# Patient Record
Sex: Female | Born: 1950 | Race: Black or African American | Hispanic: No | Smoking: Never smoker
Health system: Southern US, Community
[De-identification: ages and names within clinical notes are randomized; demographics above are authoritative.]

## PROBLEM LIST (undated history)

## (undated) DIAGNOSIS — I10 Essential (primary) hypertension: Secondary | ICD-10-CM

## (undated) HISTORY — PX: DILATION AND CURETTAGE OF UTERUS: SHX78

---

## 1999-07-08 ENCOUNTER — Other Ambulatory Visit: Admission: RE | Admit: 1999-07-08 | Discharge: 1999-07-08 | Payer: Self-pay | Admitting: Obstetrics and Gynecology

## 2001-06-14 ENCOUNTER — Emergency Department (HOSPITAL_COMMUNITY): Admission: EM | Admit: 2001-06-14 | Discharge: 2001-06-14 | Payer: Self-pay | Admitting: *Deleted

## 2002-07-17 ENCOUNTER — Emergency Department (HOSPITAL_COMMUNITY): Admission: EM | Admit: 2002-07-17 | Discharge: 2002-07-17 | Payer: Self-pay | Admitting: Emergency Medicine

## 2002-11-11 ENCOUNTER — Emergency Department (HOSPITAL_COMMUNITY): Admission: EM | Admit: 2002-11-11 | Discharge: 2002-11-11 | Payer: Self-pay

## 2003-01-25 ENCOUNTER — Emergency Department (HOSPITAL_COMMUNITY): Admission: EM | Admit: 2003-01-25 | Discharge: 2003-01-25 | Payer: Self-pay | Admitting: Emergency Medicine

## 2004-01-31 ENCOUNTER — Ambulatory Visit (HOSPITAL_COMMUNITY): Admission: RE | Admit: 2004-01-31 | Discharge: 2004-01-31 | Payer: Self-pay | Admitting: Internal Medicine

## 2005-01-27 ENCOUNTER — Ambulatory Visit (HOSPITAL_COMMUNITY): Admission: RE | Admit: 2005-01-27 | Discharge: 2005-01-27 | Payer: Self-pay | Admitting: Internal Medicine

## 2005-02-09 ENCOUNTER — Ambulatory Visit (HOSPITAL_COMMUNITY): Admission: RE | Admit: 2005-02-09 | Discharge: 2005-02-09 | Payer: Self-pay | Admitting: Internal Medicine

## 2005-05-04 ENCOUNTER — Ambulatory Visit: Payer: Self-pay | Admitting: Orthopedic Surgery

## 2005-06-04 ENCOUNTER — Ambulatory Visit: Payer: Self-pay | Admitting: Orthopedic Surgery

## 2007-10-19 ENCOUNTER — Ambulatory Visit: Payer: Self-pay | Admitting: Orthopedic Surgery

## 2007-10-19 DIAGNOSIS — M19049 Primary osteoarthritis, unspecified hand: Secondary | ICD-10-CM | POA: Insufficient documentation

## 2007-10-19 DIAGNOSIS — M654 Radial styloid tenosynovitis [de Quervain]: Secondary | ICD-10-CM

## 2009-03-25 ENCOUNTER — Ambulatory Visit (HOSPITAL_COMMUNITY): Admission: RE | Admit: 2009-03-25 | Discharge: 2009-03-25 | Payer: Self-pay | Admitting: Internal Medicine

## 2010-07-28 ENCOUNTER — Encounter: Payer: Self-pay | Admitting: Orthopedic Surgery

## 2010-08-11 ENCOUNTER — Emergency Department (HOSPITAL_COMMUNITY): Admission: EM | Admit: 2010-08-11 | Discharge: 2010-08-11 | Payer: Self-pay | Admitting: Emergency Medicine

## 2010-11-11 NOTE — Letter (Signed)
Summary: missed appointment/no show  missed appointment/no show   Imported By: Cammie Sickle 08/02/2010 19:11:53  _____________________________________________________________________  External Attachment:    Type:   Image     Comment:   External Document

## 2010-11-13 ENCOUNTER — Other Ambulatory Visit (HOSPITAL_COMMUNITY): Payer: Self-pay | Admitting: Obstetrics

## 2010-11-13 DIAGNOSIS — Z1231 Encounter for screening mammogram for malignant neoplasm of breast: Secondary | ICD-10-CM

## 2010-11-13 DIAGNOSIS — Z139 Encounter for screening, unspecified: Secondary | ICD-10-CM

## 2010-11-14 NOTE — Progress Notes (Signed)
Summary: Office Visit  Office Visit   Imported By: Elvera Maria 10/12/2007 09:02:27  _____________________________________________________________________  External Attachment:    Type:   Image     Comment:   progress

## 2010-11-21 ENCOUNTER — Other Ambulatory Visit (HOSPITAL_COMMUNITY): Payer: Self-pay | Admitting: Obstetrics

## 2010-11-21 ENCOUNTER — Ambulatory Visit (HOSPITAL_COMMUNITY)
Admission: RE | Admit: 2010-11-21 | Discharge: 2010-11-21 | Disposition: A | Discharge status: Home / Self Care | Payer: BC Managed Care – PPO | Source: Ambulatory Visit | Attending: Obstetrics | Admitting: Obstetrics

## 2010-11-21 DIAGNOSIS — Z1231 Encounter for screening mammogram for malignant neoplasm of breast: Secondary | ICD-10-CM

## 2010-11-26 ENCOUNTER — Ambulatory Visit (HOSPITAL_COMMUNITY): Payer: Self-pay

## 2011-02-11 ENCOUNTER — Encounter (HOSPITAL_COMMUNITY): Payer: BC Managed Care – PPO

## 2011-02-11 ENCOUNTER — Other Ambulatory Visit: Payer: Self-pay | Admitting: Obstetrics

## 2011-02-11 LAB — CBC
HCT: 41.2 % (ref 36.0–46.0)
Hemoglobin: 13.5 g/dL (ref 12.0–15.0)
MCV: 90.4 fL (ref 78.0–100.0)

## 2011-02-19 ENCOUNTER — Ambulatory Visit (HOSPITAL_COMMUNITY)
Admission: RE | Admit: 2011-02-19 | Discharge: 2011-02-19 | Disposition: A | Discharge status: Home / Self Care | Payer: BC Managed Care – PPO | Source: Ambulatory Visit | Attending: Obstetrics | Admitting: Obstetrics

## 2011-02-19 ENCOUNTER — Other Ambulatory Visit: Payer: Self-pay | Admitting: Obstetrics

## 2011-02-19 DIAGNOSIS — Z01818 Encounter for other preprocedural examination: Secondary | ICD-10-CM | POA: Insufficient documentation

## 2011-02-19 DIAGNOSIS — N950 Postmenopausal bleeding: Secondary | ICD-10-CM | POA: Insufficient documentation

## 2011-02-19 DIAGNOSIS — N882 Stricture and stenosis of cervix uteri: Secondary | ICD-10-CM | POA: Insufficient documentation

## 2011-02-19 DIAGNOSIS — Z01812 Encounter for preprocedural laboratory examination: Secondary | ICD-10-CM | POA: Insufficient documentation

## 2011-02-25 NOTE — Op Note (Signed)
  NAME:  Destiny Wheeler, Destiny Wheeler NO.:  [id number]  MEDICAL RECORD NO.:  [id number]           PATIENT TYPE:  O  LOCATION:  [facility]                          FACILITY[facility]  [facility]  PHYSICIAN:  Kathreen Cosier, M.D.DATE OF BIRTH:  04-15-1951  DATE OF PROCEDURE:  02/19/2011 DATE OF DISCHARGE:                              OPERATIVE REPORT   PREOPERATIVE DIAGNOSES:  Postmenopausal bleeding with cervical stenosis.  POSTOPERATIVE DIAGNOSES:  Postmenopausal bleeding with cervical stenosis.  Under general anesthesia, the patient lithotomy position, perineum and vagina prepped and draped.  Bladder emptied with a straight catheter. Bimanual exam revealed uterus to be small, negative adnexa.  Speculum placed in the vagina.  Cervix injected with 10 mL of 1% Xylocaine. Anterior lip of the cervix grasped with tenaculum.  The cervix was noted to be stenotic.  The cavity sounded 8 cm.  Cervix dilated to 21 Pratt and we could go no further, so the procedure was limited through a sharp curettage, small amount of tissue obtained.  The hysteroscopy not done. The patient taken to the recovery room in good condition.          ______________________________ Kathreen Cosier, M.D.     BAM/MEDQ  D:  02/19/2011  T:  02/19/2011  Job:  [id number]  Electronically Signed by Francoise Ceo M.D. on 02/25/2011 09:44:47 AM

## 2016-08-12 ENCOUNTER — Encounter (HOSPITAL_COMMUNITY): Payer: Self-pay | Admitting: Emergency Medicine

## 2016-08-12 ENCOUNTER — Emergency Department (HOSPITAL_COMMUNITY)
Admission: EM | Admit: 2016-08-12 | Discharge: 2016-08-12 | Disposition: A | Discharge status: Home / Self Care | Payer: Medicare (Managed Care) | Attending: Emergency Medicine | Admitting: Emergency Medicine

## 2016-08-12 DIAGNOSIS — M62838 Other muscle spasm: Secondary | ICD-10-CM

## 2016-08-12 DIAGNOSIS — E860 Dehydration: Secondary | ICD-10-CM | POA: Insufficient documentation

## 2016-08-12 DIAGNOSIS — R252 Cramp and spasm: Secondary | ICD-10-CM | POA: Insufficient documentation

## 2016-08-12 DIAGNOSIS — M79661 Pain in right lower leg: Secondary | ICD-10-CM | POA: Diagnosis present

## 2016-08-12 LAB — CBC WITH DIFFERENTIAL/PLATELET
BASOS ABS: 0 10*3/uL (ref 0.0–0.1)
BASOS PCT: 0 %
Eosinophils Absolute: 0.2 10*3/uL (ref 0.0–0.7)
Eosinophils Relative: 1 %
HEMATOCRIT: 38.3 % (ref 36.0–46.0)
HEMOGLOBIN: 12.4 g/dL (ref 12.0–15.0)
LYMPHS PCT: 25 %
Lymphs Abs: 3.1 10*3/uL (ref 0.7–4.0)
MCH: 28.8 pg (ref 26.0–34.0)
MCHC: 32.4 g/dL (ref 30.0–36.0)
MCV: 88.9 fL (ref 78.0–100.0)
MONO ABS: 0.8 10*3/uL (ref 0.1–1.0)
Monocytes Relative: 6 %
NEUTROS ABS: 8.6 10*3/uL — AB (ref 1.7–7.7)
NEUTROS PCT: 68 %
Platelets: 290 10*3/uL (ref 150–400)
RBC: 4.31 MIL/uL (ref 3.87–5.11)
RDW: 13.6 % (ref 11.5–15.5)
WBC: 12.7 10*3/uL — ABNORMAL HIGH (ref 4.0–10.5)

## 2016-08-12 LAB — BASIC METABOLIC PANEL
ANION GAP: 7 (ref 5–15)
BUN: 25 mg/dL — ABNORMAL HIGH (ref 6–20)
CALCIUM: 9.2 mg/dL (ref 8.9–10.3)
CHLORIDE: 108 mmol/L (ref 101–111)
CO2: 26 mmol/L (ref 22–32)
Creatinine, Ser: 0.81 mg/dL (ref 0.44–1.00)
GFR calc non Af Amer: >60 mL/min (ref >60)
GLUCOSE: 107 mg/dL — AB (ref 65–99)
POTASSIUM: 3.9 mmol/L (ref 3.5–5.1)
Sodium: 141 mmol/L (ref 135–145)

## 2016-08-12 LAB — MAGNESIUM: Magnesium: 2.1 mg/dL (ref 1.7–2.4)

## 2016-08-12 LAB — CK: CK TOTAL: 276 U/L — AB (ref 38–234)

## 2016-08-12 MED ORDER — NAPROXEN 375 MG PO TABS: 375.0000 mg | ORAL_TABLET | Freq: Two times a day (BID) | ORAL | 0 refills | Status: AC | PRN

## 2016-08-12 MED ORDER — SODIUM CHLORIDE 0.9 % IV BOLUS (SEPSIS)
1000.0000 mL | Freq: Once | INTRAVENOUS | Status: AC
  Administered 2016-08-12: 1000 mL via INTRAVENOUS

## 2016-08-12 MED ORDER — KETOROLAC TROMETHAMINE 15 MG/ML IJ SOLN
15.0000 mg | Freq: Once | INTRAMUSCULAR | Status: AC
  Administered 2016-08-12: 15 mg via INTRAVENOUS
  Filled 2016-08-12: qty 1

## 2016-08-12 MED ORDER — CYCLOBENZAPRINE HCL 10 MG PO TABS: 10.0000 mg | ORAL_TABLET | Freq: Three times a day (TID) | ORAL | 0 refills | Status: DC | PRN

## 2016-08-12 MED ORDER — OMEPRAZOLE 20 MG PO CPDR: 20.0000 mg | DELAYED_RELEASE_CAPSULE | Freq: Every day | ORAL | 0 refills | Status: DC

## 2016-08-12 MED ORDER — DIAZEPAM 5 MG/ML IJ SOLN
5.0000 mg | Freq: Once | INTRAMUSCULAR | Status: AC
  Administered 2016-08-12: 5 mg via INTRAVENOUS
  Filled 2016-08-12: qty 2

## 2016-08-12 NOTE — ED Notes (Signed)
Bed: WA06 Expected date:  Expected time:  Means of arrival:  Comments: EMS- 65YO female hypertensive/ cramping

## 2016-08-12 NOTE — ED Triage Notes (Signed)
Patient BIB [facility] from patients work. Patient reported cramps that started in her RT leg that traveled upward, then spread to her Left leg as well as bilateral upper extremities. EMS reports an initial BP of 230/110 and 280/110 upon sitting up. Patient has no known hx of of HTN. Pressure now 172/86. Patient denies headache, EMS states- 12 lead unremarkable. Patient states she has some dizziness upon standing.

## 2016-08-12 NOTE — ED Provider Notes (Signed)
[facility] DEPT Provider Note   CSN: [id number] Arrival date & time: 08/12/16  0215     History   Chief Complaint Chief Complaint  Patient presents with  . Muscle Pain    HPI Destiny Wheeler is a 65 y.o. female.  HPI 65 year old female with no significant past medical history presents with diffuse muscle cramps. The patient states she was at work earlier today. She sits a lot at work but was standing at the time. She began to feel an acute, cramp-like sensation in her right leg. She attempted to walk but was unable to do so due to stiffness and pain in this leg. The cramps then spread to her left leg as well as her bilateral upper extremities. The cramps lasted approximately 30 minutes and she was unable to move due to pain. She notified her supervisor who called EMS. The symptoms have now resolved although she endorses mild, aching, soreness in her bilateral legs and arms. She denies any recent medication changes. Denies any recent illnesses but does admit that she does not drink as much water as she should. She has a history of cramping in the past. Denies any dietary changes.    History reviewed. No pertinent past medical history.  Patient Active Problem List   Diagnosis Date Noted  . OSTEOARTHROSIS UNSPEC WHETHER GEN/LOCALIZED HAND 10/19/2007  . 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 No pertinent surgical history.  OB History    No data available       Home Medications    Prior to Admission medications   Medication Sig Start Date End Date Taking? Authorizing Provider  cyclobenzaprine (FLEXERIL) 10 MG tablet Take 1 tablet (10 mg total) by mouth 3 (three) times daily as needed for muscle spasms. 08/12/16   Shaune Pollackameron Norleen Xie, MD  naproxen (NAPROSYN) 375 MG tablet Take 1 tablet (375 mg total) by mouth 2 (two) times daily as needed for moderate pain. 08/12/16 08/19/16  Shaune Pollackameron Savino Whisenant, MD  omeprazole (PRILOSEC) 20 MG capsule Take 1 capsule (20 mg total) by mouth daily.  08/12/16 08/22/16  Shaune Pollackameron Soundra Lampley, MD    Family History History reviewed. No pertinent family history.  Social History Social History  Substance Use Topics  . Smoking status: Never Smoker  . Smokeless tobacco: Never Used  . Alcohol use No     Allergies   Review of patient's allergies indicates no known allergies.   Review of Systems Review of Systems  Constitutional: Negative for chills and fever.  HENT: Negative for congestion, rhinorrhea and sore throat.   Eyes: Negative for visual disturbance.  Respiratory: Negative for cough, shortness of breath and wheezing.   Cardiovascular: Negative for chest pain and leg swelling.  Gastrointestinal: Negative for abdominal pain, diarrhea, nausea and vomiting.  Genitourinary: Negative for dysuria, flank pain, vaginal bleeding and vaginal discharge.  Musculoskeletal: Positive for gait problem and myalgias. Negative for neck pain.  Skin: Negative for rash.  Allergic/Immunologic: Negative for immunocompromised state.  Neurological: Negative for syncope and headaches.  Hematological: Does not bruise/bleed easily.  All other systems reviewed and are negative.    Physical Exam Updated Vital Signs BP 149/66 (BP Location: Right Arm)   Pulse 63   Temp 97.7 F (36.5 C) (Oral)   Resp 16   SpO2 99%   Physical Exam  Constitutional: She is oriented to person, place, and time. She appears well-developed and well-nourished. No distress.  HENT:  Head: Normocephalic and atraumatic.  Eyes: Conjunctivae are normal.  Neck: Neck supple.  Cardiovascular: Normal rate, regular rhythm and normal heart sounds.  Exam reveals no friction rub.   No murmur heard. Pulmonary/Chest: Effort normal and breath sounds normal. No respiratory distress. She has no wheezes. She has no rales.  Abdominal: She exhibits no distension.  Musculoskeletal: She exhibits no edema.  Mild tenderness to palpation of her bilateral quadriceps and gastrocnemius. No deformity.  Palpable spasm of left gastrocnemius noted during exam, lasting approximately 30 seconds. No deformity or bruising.  Neurological: She is alert and oriented to person, place, and time. She exhibits normal muscle tone.  Skin: Skin is warm. Capillary refill takes less than 2 seconds.  Psychiatric: She has a normal mood and affect.  Nursing note and vitals reviewed.    ED Treatments / Results  Labs (all labs ordered are listed, but only abnormal results are displayed) Labs Reviewed  CBC WITH DIFFERENTIAL/PLATELET - Abnormal; Notable for the following:       Result Value   WBC 12.7 (*)    Neutro Abs 8.6 (*)    All other components within normal limits  BASIC METABOLIC PANEL - Abnormal; Notable for the following:    Glucose, Bld 107 (*)    BUN 25 (*)    All other components within normal limits  CK - Abnormal; Notable for the following:    Total CK 276 (*)    All other components within normal limits  MAGNESIUM    EKG  EKG Interpretation None       Radiology No results found.  Procedures Procedures (including critical care time)  Medications Ordered in ED Medications  sodium chloride 0.9 % bolus 1,000 mL (0 mLs Intravenous Stopped 08/12/16 0459)  diazepam (VALIUM) injection 5 mg (5 mg Intravenous Given 08/12/16 0346)  ketorolac (TORADOL) 15 MG/ML injection 15 mg (15 mg Intravenous Given 08/12/16 0537)     Initial Impression / Assessment and Plan / ED Course  I have reviewed the triage vital signs and the nursing notes.  Pertinent labs & imaging results that were available during my care of the patient were reviewed by me and considered in my medical decision making (see chart for details).  Clinical Course    65 yo F with PMHx as above who p/w diffuse muscle cramps while standing at work. On arrival, VSS and WNL. Pt with palpable spasm of gastroc but exam o/w unremarkable. Distal NV is intact. Suspect muscle cramping 2/2 dehydration, deconditioning. Sx markedly  improved with IVF and valium x 1. Screening lab work obtained - she has mild leukocytosis, but no fever, recent infectious sx. CK minimally elevated, doubt significant myositis or muscle breakdown. BMP with elevated BUN c/w dehydration. Mag normal. Pt now ambulatory throughout ED after IVF.   Will d/c with flexeril, NSAIDs, an encouraged fluid at home. No other red flags. Pt HDS and well-appearing.  Final Clinical Impressions(s) / ED Diagnoses   Final diagnoses:  Muscle cramp  Muscle spasm  Dehydration    New Prescriptions Discharge Medication List as of 08/12/2016  5:15 AM    START taking these medications   Details  cyclobenzaprine (FLEXERIL) 10 MG tablet Take 1 tablet (10 mg total) by mouth 3 (three) times daily as needed for muscle spasms., Starting Wed 08/12/2016, Print    naproxen (NAPROSYN) 375 MG tablet Take 1 tablet (375 mg total) by mouth 2 (two) times daily as needed for moderate pain., Starting Wed 08/12/2016, Until Wed 08/19/2016, Print         Shaune Pollackameron Alianys Chacko, MD 08/12/16 2030

## 2018-02-05 ENCOUNTER — Emergency Department (HOSPITAL_COMMUNITY): Payer: Medicare Other

## 2018-02-05 ENCOUNTER — Encounter (HOSPITAL_COMMUNITY): Payer: Self-pay

## 2018-02-05 ENCOUNTER — Other Ambulatory Visit: Payer: Self-pay

## 2018-02-05 ENCOUNTER — Inpatient Hospital Stay (HOSPITAL_COMMUNITY)
Admission: EM | Admit: 2018-02-05 | Discharge: 2018-02-08 | DRG: 392 | Disposition: A | Discharge status: Home / Self Care | Payer: Medicare Other | Attending: Internal Medicine | Admitting: Internal Medicine

## 2018-02-05 DIAGNOSIS — K625 Hemorrhage of anus and rectum: Secondary | ICD-10-CM | POA: Diagnosis present

## 2018-02-05 DIAGNOSIS — A09 Infectious gastroenteritis and colitis, unspecified: Secondary | ICD-10-CM | POA: Diagnosis present

## 2018-02-05 DIAGNOSIS — K529 Noninfective gastroenteritis and colitis, unspecified: Secondary | ICD-10-CM | POA: Diagnosis present

## 2018-02-05 DIAGNOSIS — I10 Essential (primary) hypertension: Secondary | ICD-10-CM | POA: Diagnosis present

## 2018-02-05 DIAGNOSIS — E876 Hypokalemia: Secondary | ICD-10-CM | POA: Diagnosis present

## 2018-02-05 DIAGNOSIS — Z79899 Other long term (current) drug therapy: Secondary | ICD-10-CM | POA: Diagnosis not present

## 2018-02-05 DIAGNOSIS — Z888 Allergy status to other drugs, medicaments and biological substances status: Secondary | ICD-10-CM | POA: Diagnosis not present

## 2018-02-05 DIAGNOSIS — D62 Acute posthemorrhagic anemia: Secondary | ICD-10-CM | POA: Diagnosis present

## 2018-02-05 LAB — COMPREHENSIVE METABOLIC PANEL
ALT: 20 U/L (ref 14–54)
ANION GAP: 14 (ref 5–15)
AST: 28 U/L (ref 15–41)
Albumin: 3.9 g/dL (ref 3.5–5.0)
Alkaline Phosphatase: 79 U/L (ref 38–126)
BILIRUBIN TOTAL: 0.6 mg/dL (ref 0.3–1.2)
BUN: 15 mg/dL (ref 6–20)
CHLORIDE: 106 mmol/L (ref 101–111)
CO2: 22 mmol/L (ref 22–32)
Calcium: 9.1 mg/dL (ref 8.9–10.3)
Creatinine, Ser: 0.66 mg/dL (ref 0.44–1.00)
Glucose, Bld: 93 mg/dL (ref 65–99)
Potassium: 3.2 mmol/L — ABNORMAL LOW (ref 3.5–5.1)
Sodium: 142 mmol/L (ref 135–145)
TOTAL PROTEIN: 7.5 g/dL (ref 6.5–8.1)

## 2018-02-05 LAB — URINALYSIS, ROUTINE W REFLEX MICROSCOPIC
BILIRUBIN URINE: NEGATIVE
Bacteria, UA: NONE SEEN
Glucose, UA: NEGATIVE mg/dL
Ketones, ur: 5 mg/dL — AB
LEUKOCYTES UA: NEGATIVE
Nitrite: NEGATIVE
PH: 5 (ref 5.0–8.0)
Protein, ur: NEGATIVE mg/dL
SPECIFIC GRAVITY, URINE: 1.023 (ref 1.005–1.030)

## 2018-02-05 LAB — TYPE AND SCREEN
ABO/RH(D): B NEG
ANTIBODY SCREEN: POSITIVE

## 2018-02-05 LAB — CBC
HCT: 39.2 % (ref 36.0–46.0)
Hemoglobin: 13 g/dL (ref 12.0–15.0)
MCH: 30.3 pg (ref 26.0–34.0)
MCHC: 33.2 g/dL (ref 30.0–36.0)
MCV: 91.4 fL (ref 78.0–100.0)
Platelets: 277 10*3/uL (ref 150–400)
RBC: 4.29 MIL/uL (ref 3.87–5.11)
RDW: 14.3 % (ref 11.5–15.5)
WBC: 18.7 10*3/uL — AB (ref 4.0–10.5)

## 2018-02-05 LAB — POC OCCULT BLOOD, ED: FECAL OCCULT BLD: POSITIVE — AB

## 2018-02-05 MED ORDER — METRONIDAZOLE IN NACL 5-0.79 MG/ML-% IV SOLN
500.0000 mg | Freq: Three times a day (TID) | INTRAVENOUS | Status: DC
  Administered 2018-02-05 – 2018-02-08 (×8): 500 mg via INTRAVENOUS
  Filled 2018-02-05 (×8): qty 100

## 2018-02-05 MED ORDER — CIPROFLOXACIN IN D5W 400 MG/200ML IV SOLN
400.0000 mg | Freq: Two times a day (BID) | INTRAVENOUS | Status: DC
  Administered 2018-02-05 – 2018-02-08 (×6): 400 mg via INTRAVENOUS
  Filled 2018-02-05 (×6): qty 200

## 2018-02-05 MED ORDER — MORPHINE SULFATE (PF) 4 MG/ML IV SOLN
4.0000 mg | Freq: Once | INTRAVENOUS | Status: AC
  Administered 2018-02-05: 4 mg via INTRAVENOUS
  Filled 2018-02-05: qty 1

## 2018-02-05 MED ORDER — ONDANSETRON HCL 4 MG/2ML IJ SOLN
4.0000 mg | Freq: Four times a day (QID) | INTRAMUSCULAR | Status: DC | PRN
  Administered 2018-02-06: 4 mg via INTRAVENOUS
  Filled 2018-02-05: qty 2

## 2018-02-05 MED ORDER — CEFTRIAXONE SODIUM 2 G IJ SOLR
2.0000 g | Freq: Once | INTRAMUSCULAR | Status: AC
  Administered 2018-02-05: 2 g via INTRAVENOUS
  Filled 2018-02-05: qty 20

## 2018-02-05 MED ORDER — ACETAMINOPHEN 650 MG RE SUPP: 650.0000 mg | Freq: Four times a day (QID) | RECTAL | Status: DC | PRN

## 2018-02-05 MED ORDER — MORPHINE SULFATE (PF) 4 MG/ML IV SOLN
2.0000 mg | Freq: Once | INTRAVENOUS | Status: AC
  Administered 2018-02-05: 2 mg via INTRAVENOUS
  Filled 2018-02-05: qty 1

## 2018-02-05 MED ORDER — MORPHINE SULFATE (PF) 2 MG/ML IV SOLN
2.0000 mg | Freq: Once | INTRAVENOUS | Status: AC
  Administered 2018-02-05: 2 mg via INTRAVENOUS
  Filled 2018-02-05: qty 1

## 2018-02-05 MED ORDER — ONDANSETRON HCL 4 MG PO TABS
4.0000 mg | ORAL_TABLET | Freq: Four times a day (QID) | ORAL | Status: DC | PRN
  Administered 2018-02-05: 4 mg via ORAL
  Filled 2018-02-05: qty 1

## 2018-02-05 MED ORDER — SODIUM CHLORIDE 0.9 % IV BOLUS
500.0000 mL | Freq: Once | INTRAVENOUS | Status: AC
  Administered 2018-02-05: 500 mL via INTRAVENOUS

## 2018-02-05 MED ORDER — METRONIDAZOLE IN NACL 5-0.79 MG/ML-% IV SOLN
500.0000 mg | Freq: Once | INTRAVENOUS | Status: AC
  Administered 2018-02-05: 500 mg via INTRAVENOUS
  Filled 2018-02-05: qty 100

## 2018-02-05 MED ORDER — ACETAMINOPHEN 325 MG PO TABS: 650.0000 mg | ORAL_TABLET | Freq: Four times a day (QID) | ORAL | Status: DC | PRN

## 2018-02-05 MED ORDER — HYDRALAZINE HCL 20 MG/ML IJ SOLN: 10.0000 mg | Freq: Four times a day (QID) | INTRAMUSCULAR | Status: DC | PRN

## 2018-02-05 MED ORDER — ONDANSETRON HCL 4 MG/2ML IJ SOLN
4.0000 mg | Freq: Once | INTRAMUSCULAR | Status: AC
  Administered 2018-02-05: 4 mg via INTRAVENOUS
  Filled 2018-02-05: qty 2

## 2018-02-05 MED ORDER — HYDROCODONE-ACETAMINOPHEN 5-325 MG PO TABS
1.0000 | ORAL_TABLET | ORAL | Status: DC | PRN
  Administered 2018-02-05 – 2018-02-08 (×10): 1 via ORAL
  Filled 2018-02-05 (×11): qty 1

## 2018-02-05 MED ORDER — SODIUM CHLORIDE 0.9 % IV SOLN
INTRAVENOUS | Status: DC
  Administered 2018-02-05 – 2018-02-08 (×5): via INTRAVENOUS

## 2018-02-05 MED ORDER — AMLODIPINE BESYLATE 5 MG PO TABS
5.0000 mg | ORAL_TABLET | Freq: Every day | ORAL | Status: DC
  Administered 2018-02-05 – 2018-02-08 (×4): 5 mg via ORAL
  Filled 2018-02-05 (×4): qty 1

## 2018-02-05 NOTE — ED Notes (Signed)
Pt cannot use restroom at this time, aware urine specimen is needed.  

## 2018-02-05 NOTE — ED Triage Notes (Signed)
Patient states she began having mid abdominal pain, vomiting, and diarrhea yesterday. Today she states she saw bright red blood in her diarrhea this AM x3.

## 2018-02-05 NOTE — H&P (Signed)
History and Physical        Hospital Admission Note Date: 02/05/2018  Patient name: Destiny Wheeler Medical record number: [id number] Date of birth: 1951-05-28 Age: 67 y.o. Gender: female  PCP: Patient, No Pcp Per    Patient coming from: Home  I have reviewed all records in the [facility].    Chief Complaint:  Nausea, vomiting, diarrhea, bright red blood per rectum for 1 day  HPI: Patient is a 67 year old female with no significant past medical history presented to ED with nausea, vomiting, abdominal pain and diarrhea that started yesterday around 3 PM.  Per patient, she was in her baseline state of health until yesterday when she had takeout lunch with chicken tenders and potatoes from [place], [place] around 2 PM.  After an hour, patient started having nausea, multiple episodes of vomiting, denied any hematemesis.  She reported abdominal pain, diffusely but worse in the left lower quadrant.  She also had multiple episodes of diarrhea, noticed bright red blood in the diarrhea.  She had no similar prior episodes.  She had chills but denied any fevers, no sick contacts.  She felt tired but no chest pain, shortness of breath, dizziness or syncope.  ED work-up/course:  Temp 98.5, respiratory rate 16, heart rate 72, BP 180/102 CT abdomen showed infectious or inflammatory colitis involving the left colon from the left transverse colon to the lower descending colon.   Review of Systems: Positives marked in 'bold' Constitutional: Felt chills, nausea and vomiting, fatigued yesterday   HEENT: Denies photophobia, eye pain, redness, hearing loss, ear pain, congestion, sore throat, rhinorrhea, sneezing, mouth sores, trouble swallowing, neck pain, neck stiffness and tinnitus.   Respiratory: Denies SOB, DOE, cough, chest tightness,  and wheezing.   Cardiovascular: Denies chest  pain, palpitations and leg swelling.  Gastrointestinal: Please see HPI Genitourinary: Denies dysuria, urgency, frequency, hematuria, flank pain and difficulty urinating.  Musculoskeletal: Denies myalgias, back pain, joint swelling, arthralgias and gait problem.  Skin: Denies pallor, rash and wound.  Neurological: Denies dizziness, seizures, syncope, weakness, light-headedness, numbness and headaches.  Hematological: Denies adenopathy. Easy bruising, personal or family bleeding history  Psychiatric/Behavioral: Denies suicidal ideation, mood changes, confusion, nervousness, sleep disturbance and agitation  Past Medical History: History reviewed. No pertinent past medical history.  Past surgical history  history reviewed. No pertinent surgical history.  Medications: Prior to Admission medications   Medication Sig Start Date End Date Taking? Authorizing Provider  acetaminophen (TYLENOL) 500 MG tablet Take 500 mg by mouth every 4 (four) hours as needed for moderate pain.   Yes [provider]  amLODipine-benazepril (LOTREL) 5-10 MG capsule Take 1 capsule by mouth daily. 01/21/18  Yes [provider]  Multiple Vitamins-Minerals (CENTRUM SILVER 50+WOMEN PO) Take 1 tablet by mouth daily.   Yes [provider]  cyclobenzaprine (FLEXERIL) 10 MG tablet Take 1 tablet (10 mg total) by mouth 3 (three) times daily as needed for muscle spasms. Patient not taking: Reported on 02/05/2018 08/12/16   Shaune Pollack, MD  omeprazole (PRILOSEC) 20 MG capsule Take 1 capsule (20 mg total) by mouth daily. 08/12/16 08/22/16  Shaune Pollack, MD    Allergies:   Allergies  Allergen Reactions  . Iodine     Reports face swelling from IV contrast    Social History:  reports that she has never smoked. She has never used smokeless tobacco. She reports that she does not drink alcohol or use drugs.  Family History: History reviewed. No pertinent family history.  No history of colon cancer  in the family.  Physical Exam: Blood pressure (!) 173/76, pulse 60, temperature 98.5 F (36.9 C), temperature source Oral, resp. rate 20, height  (1.727 m), weight 102.5 kg (226 lb), SpO2 99 %. General: Alert, awake, oriented x3, in no acute distress. Eyes: pink conjunctiva,anicteric sclera, pupils equal and reactive to light and accomodation, HEENT: normocephalic, atraumatic, oropharynx clear Neck: supple, no masses or lymphadenopathy, no goiter, no bruits, no JVD CVS: Regular rate and rhythm, without murmurs, rubs or gallops. No lower extremity edema Resp : Clear to auscultation bilaterally, no wheezing, rales or rhonchi. GI : Soft, mild diffuse TTP, worse in LLQ, nondistended, positive bowel sounds, no masses. No hepatomegaly. No hernia.  Musculoskeletal: No clubbing or cyanosis, positive pedal pulses. No contracture. ROM intact  Neuro: Grossly intact, no focal neurological deficits, strength 5/5 upper and lower extremities bilaterally Psych: alert and oriented x 3, normal mood and affect Skin: no rashes or lesions, warm and dry   LABS on Admission: I have personally reviewed all the labs and imagings below    Basic Metabolic Panel: Recent Labs  Lab 02/05/18 1356  NA 142  K 3.2*  CL 106  CO2 22  GLUCOSE 93  BUN 15  CREATININE 0.66  CALCIUM 9.1   Liver Function Tests: Recent Labs  Lab 02/05/18 1356  AST 28  ALT 20  ALKPHOS 79  BILITOT 0.6  PROT 7.5  ALBUMIN 3.9   No results for input(s): LIPASE, AMYLASE in the last 168 hours. No results for input(s): AMMONIA in the last 168 hours. CBC: Recent Labs  Lab 02/05/18 1356  WBC 18.7*  HGB 13.0  HCT 39.2  MCV 91.4  PLT 277   Cardiac Enzymes: No results for input(s): CKTOTAL, CKMB, CKMBINDEX, TROPONINI in the last 168 hours. BNP: Invalid input(s): POCBNP CBG: No results for input(s): GLUCAP in the last 168 hours.  Radiological Exams on Admission:  Ct Abdomen Pelvis Wo Contrast  Result Date:  02/05/2018 CLINICAL DATA:  Patient states she began having mid abdominal pain, vomiting, and diarrhea yesterday. Today she states she saw bright red blood in her diarrhea this AM x3. EXAM: CT ABDOMEN AND PELVIS WITHOUT CONTRAST TECHNIQUE: Multidetector CT imaging of the abdomen and pelvis was performed following the standard protocol without IV contrast. COMPARISON:  None. FINDINGS: Lower chest: No acute abnormality. Hepatobiliary: Liver mildly enlarged measuring 23 cm in greatest transverse dimension. There is diffuse decreased attenuation of the liver consistent with fatty infiltration. No liver mass or focal lesion. Gallbladder is unremarkable. No bile duct dilation. Pancreas: Unremarkable. No pancreatic ductal dilatation or surrounding inflammatory changes. Spleen: Normal in size without focal abnormality. Adrenals/Urinary Tract: No adrenal masses. Kidneys are normal in size, orientation and position. Possible mild left hydronephrosis versus multiple renal sinus cysts. The latter is favored given that the left ureter is normal course and caliber. There are no intrarenal or ureteral stones. No renal masses. Right renal collecting system is nondistended. Bladder is unremarkable. Stomach/Bowel: There is hazy inflammatory change in the pericolonic fat extending from the left transverse colon across the splenic flexure to the lower descending colon. There are several descending colon diverticula. This  portion of the colon is decompressed. Wall thickening is suspected. The extent of involvement is greater than expected for diverticulitis. Findings are consistent with infectious or inflammatory colitis. There are other scattered colonic diverticula. No other inflammatory changes. Stomach and small bowel are unremarkable.  Appendix not visualized. Vascular/Lymphatic: No significant vascular findings are present. No enlarged abdominal or pelvic lymph nodes. Reproductive: Uterus and bilateral adnexa are unremarkable.  Other: Small fat containing umbilical hernia. No other hernias. No ascites. Musculoskeletal: No fracture or acute finding. Small bone island in the left ischium. No osteolytic lesions. IMPRESSION: 1. Infectious or inflammatory colitis involving the left colon from the left transverse colon to the lower descending colon. 2. No other acute abnormality. 3. Mild hepatomegaly.  Diffuse hepatic steatosis. 4. Scattered colonic diverticula.  No convincing diverticulitis. Electronically Signed   By: Amie Portland M.D.   On: 02/05/2018 14:46      EKG: Independently reviewed.  No EKG available   Assessment/Plan Principal Problem:   Acute colitis likely infectious given symptoms acutely started after eating, no prior episodes -No vomiting in ED, will place on aggressive IV fluid hydration, clear liquid diet, pain control, antiemetics -Placed on IV ciprofloxacin and Flagyl (obtain EKG for baseline and QTC) -Recommended patient for outpatient colonoscopy once acute colitis has resolved.  Active Problems:   Rectal bleeding -Likely due to acute colitis, monitor H&H closely, currently no active bleeding. -Recommended outpatient colonoscopy once acute colitis is resolved    Accelerated hypertension - Patient denies any prior history of hypertension, placed on low-dose Norvasc, hydralazine IV-with parameters as needed    Hypokalemia -Replaced   DVT prophylaxis: SCDs  CODE STATUS: Full code  Consults called: None  Family Communication: Admission, patients condition and plan of care including tests being ordered have been discussed with the patient and daughter  who indicates understanding and agree with the plan and Code Status  Admission status: inpatient, telemetry  Disposition plan: Further plan will depend as patient's clinical course evolves and further radiologic and laboratory data become available.    At the time of admission, it appears that the appropriate admission status for this  patient is INPATIENT . This is judged to be reasonable and necessary in order to provide the required intensity of service to ensure the patient's safety given the presenting symptoms intractable nausea, vomiting, bloody diarrhea, abdominal pain, physical exam findings, and initial radiographic and laboratory data in the context of their chronic comorbidities.  The medical decision making on this patient was of high complexity and the patient is at high risk for clinical deterioration, therefore this is a level 3 visit.     Time Spent on Admission:     Ripudeep Rai M.D. [facility] 02/05/2018, 4:57 PM Pager: [phone]  If 7PM-7AM, please contact night-coverage [email or web address] Password [email or web address]

## 2018-02-05 NOTE — ED Provider Notes (Signed)
[facility]-EMERGENCY DEPT Provider Note   CSN: [id number] Arrival date & time: 02/05/18  1147     History   Chief Complaint Chief Complaint  Patient presents with  . Abdominal Pain  . Emesis  . Diarrhea  . Rectal Bleeding    HPI KYERA FELAN is a 67 y.o. female presents today for evaluation of abdominal pain, nausea, vomiting, and diarrhea with bright red blood per rectum that started yesterday.  She reports that today she has thrown up twice and had 3 episodes of diarrhea.  She reports that she is unable to keep anything down.  She has not taken her blood pressure medicines today due to vomiting.  She denies any blood thinner use, no recent trauma.  She denies any dysuria, hematuria, increased urgency or frequency.  She denies any known sick contacts.  Reports that while here she went to the bathroom and had what she thought was diarrhea, however when she looked in the toilet she only saw blood without stool.  She reports that she in the past has had allergic reactions including facial swelling when given IV contrast.   HPI  History reviewed. No pertinent past medical history.  Patient Active Problem List   Diagnosis Date Noted  . OSTEOARTHROSIS UNSPEC WHETHER GEN/LOCALIZED HAND 10/19/2007  . 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 No pertinent surgical history.   OB History   None      Home Medications    Prior to Admission medications   Medication Sig Start Date End Date Taking? Authorizing Provider  cyclobenzaprine (FLEXERIL) 10 MG tablet Take 1 tablet (10 mg total) by mouth 3 (three) times daily as needed for muscle spasms. 08/12/16   Shaune Pollack, MD  omeprazole (PRILOSEC) 20 MG capsule Take 1 capsule (20 mg total) by mouth daily. 08/12/16 08/22/16  Shaune Pollack, MD    Family History History reviewed. No pertinent family history.  Social History Social History   Tobacco Use  . Smoking status: Never Smoker  . Smokeless  tobacco: Never Used  Substance Use Topics  . Alcohol use: No  . Drug use: No     Allergies   Iodine   Review of Systems Review of Systems  Constitutional: Negative for chills and fever.  HENT: Negative for congestion.   Respiratory: Negative for shortness of breath.   Cardiovascular: Negative for chest pain.  Gastrointestinal: Positive for abdominal pain, anal bleeding, blood in stool, diarrhea, nausea and vomiting.  Genitourinary: Negative for dysuria, flank pain, frequency, urgency and vaginal pain.  Skin: Negative for color change and rash.  Neurological: Negative for light-headedness and headaches.  All other systems reviewed and are negative.    Physical Exam Updated Vital Signs BP (!) 174/70   Pulse 80   Temp 98.5 F (36.9 C) (Oral)   Resp 20   Ht  (1.727 m)   Wt 102.5 kg (226 lb)   SpO2 100%   BMI 34.36 kg/m   Physical Exam  Constitutional: She appears well-developed and well-nourished.  Non-toxic appearance. No distress.  HENT:  Head: Normocephalic and atraumatic.  Mouth/Throat: Oropharynx is clear and moist.  Eyes: Conjunctivae are normal.  Neck: Neck supple.  Cardiovascular: Normal rate, regular rhythm and normal heart sounds.  No murmur heard. Pulmonary/Chest: Effort normal and breath sounds normal. No respiratory distress.  Abdominal: Soft. Bowel sounds are normal. There is tenderness in the periumbilical area, left upper quadrant and left lower quadrant. There is no rigidity, no rebound and  no guarding.  Genitourinary:  Genitourinary Comments: Rectal exam performed with chaperone in room.  No obvious external hemorrhoids.  Frank blood on exam.  Musculoskeletal: She exhibits no edema.  Neurological: She is alert.  Skin: Skin is warm and dry.  Psychiatric: She has a normal mood and affect.  Nursing note and vitals reviewed.    ED Treatments / Results  Labs (all labs ordered are listed, but only abnormal results are displayed) Labs Reviewed   COMPREHENSIVE METABOLIC PANEL - Abnormal; Notable for the following components:      Result Value   Potassium 3.2 (*)    All other components within normal limits  CBC - Abnormal; Notable for the following components:   WBC 18.7 (*)    All other components within normal limits  URINALYSIS, ROUTINE W REFLEX MICROSCOPIC - Abnormal; Notable for the following components:   APPearance HAZY (*)    Hgb urine dipstick MODERATE (*)    Ketones, ur 5 (*)    All other components within normal limits  POC OCCULT BLOOD, ED - Abnormal; Notable for the following components:   Fecal Occult Bld POSITIVE (*)    All other components within normal limits  TYPE AND SCREEN  ABO/RH    EKG None  Radiology Ct Abdomen Pelvis Wo Contrast  Result Date: 02/05/2018 CLINICAL DATA:  Patient states she began having mid abdominal pain, vomiting, and diarrhea yesterday. Today she states she saw bright red blood in her diarrhea this AM x3. EXAM: CT ABDOMEN AND PELVIS WITHOUT CONTRAST TECHNIQUE: Multidetector CT imaging of the abdomen and pelvis was performed following the standard protocol without IV contrast. COMPARISON:  None. FINDINGS: Lower chest: No acute abnormality. Hepatobiliary: Liver mildly enlarged measuring 23 cm in greatest transverse dimension. There is diffuse decreased attenuation of the liver consistent with fatty infiltration. No liver mass or focal lesion. Gallbladder is unremarkable. No bile duct dilation. Pancreas: Unremarkable. No pancreatic ductal dilatation or surrounding inflammatory changes. Spleen: Normal in size without focal abnormality. Adrenals/Urinary Tract: No adrenal masses. Kidneys are normal in size, orientation and position. Possible mild left hydronephrosis versus multiple renal sinus cysts. The latter is favored given that the left ureter is normal course and caliber. There are no intrarenal or ureteral stones. No renal masses. Right renal collecting system is nondistended. Bladder is  unremarkable. Stomach/Bowel: There is hazy inflammatory change in the pericolonic fat extending from the left transverse colon across the splenic flexure to the lower descending colon. There are several descending colon diverticula. This portion of the colon is decompressed. Wall thickening is suspected. The extent of involvement is greater than expected for diverticulitis. Findings are consistent with infectious or inflammatory colitis. There are other scattered colonic diverticula. No other inflammatory changes. Stomach and small bowel are unremarkable.  Appendix not visualized. Vascular/Lymphatic: No significant vascular findings are present. No enlarged abdominal or pelvic lymph nodes. Reproductive: Uterus and bilateral adnexa are unremarkable. Other: Small fat containing umbilical hernia. No other hernias. No ascites. Musculoskeletal: No fracture or acute finding. Small bone island in the left ischium. No osteolytic lesions. IMPRESSION: 1. Infectious or inflammatory colitis involving the left colon from the left transverse colon to the lower descending colon. 2. No other acute abnormality. 3. Mild hepatomegaly.  Diffuse hepatic steatosis. 4. Scattered colonic diverticula.  No convincing diverticulitis. Electronically Signed   By: Amie Portland M.D.   On: 02/05/2018 14:46    Procedures Procedures (including critical care time)  Medications Ordered in ED Medications  cefTRIAXone (ROCEPHIN) 2  g in sodium chloride 0.9 % 100 mL IVPB (has no administration in time range)    And  metroNIDAZOLE (FLAGYL) IVPB 500 mg (has no administration in time range)  morphine 2 MG/ML injection 2 mg (has no administration in time range)  ondansetron (ZOFRAN) injection 4 mg (4 mg Intravenous Given 02/05/18 1357)  sodium chloride 0.9 % bolus 500 mL (0 mLs Intravenous Stopped 02/05/18 1458)  morphine 4 MG/ML injection 4 mg (4 mg Intravenous Given 02/05/18 1357)     Initial Impression / Assessment and Plan / ED Course  I  have reviewed the triage vital signs and the nursing notes.  Pertinent labs & imaging results that were available during my care of the patient were reviewed by me and considered in my medical decision making (see chart for details).  Clinical Course as of Feb 07 20  Sat Feb 05, 2018  1348 Rectal exam performed in presence of chaperone, frank bright red blood present.    [EH]  1536 CT Abdomen Pelvis Wo Contrast [EH]  1538 WBC(!): 18.7 [EH]  1538 Fecal Occult Blood, POC(!): POSITIVE [EH]  1559 Spoke with hospitalist who will admit patient.    [EH]    Clinical Course User Index [EH] Cristina Gong, PA-C   Derinda Late presents today for evaluation of abdominal pain, emesis, and diarrhea.  On rectal exam she had frank bright red blood per rectum.  She has pain in the left lower quadrant, abdomen pelvis CT scan was obtained without contrast due to allergy showing concern for colitis.  White count is elevated at 18.7.  Patient is hemodynamically stable, not tachycardic.  She is hypertensive but she reports she has not taken her blood pressure meds today.  She was started on IV antibiotics and she agreed for admission.  Hospitalist was consulted due to rectal bleeding for admission.  Hospitalist agreed to admit.  This patient was seen as a shared visit with Dr. Particia Nearing who agreed with plan.     Final Clinical Impressions(s) / ED Diagnoses   Final diagnoses:  Colitis  Rectal bleeding    ED Discharge Orders    None       Norman Clay 02/06/18 Juventino Slovak, MD 02/06/18 [phone]

## 2018-02-05 NOTE — ED Notes (Signed)
ED TO INPATIENT HANDOFF REPORT  Name/Age/Gender Destiny Wheeler 67 y.o. female  Code Status   Home/SNF/Other Home  Chief Complaint emesis, diarrhea  Level of Care/Admitting Diagnosis ED Disposition    ED Disposition Condition [facility]: [place] [[id number]]  Level of Care: Telemetry [5]  Admit to tele based on following criteria: Other see comments  Comments: GI bleed with colitis  Diagnosis: Acute colitis [517616]  Admitting Physician: RAI, [place] K [[id number]]  Attending Physician: RAI, RIPUDEEP K [[id number]]  Estimated length of stay: past midnight tomorrow  Certification:: I certify this patient will need inpatient services for at least 2 midnights  PT Class (Do Not Modify): Inpatient [101]  PT Acc Code (Do Not Modify): Private [1]       Medical History History reviewed. No pertinent past medical history.  Allergies Allergies  Allergen Reactions  . Iodine     Reports face swelling from IV contrast    IV Location/Drains/Wounds Patient Lines/Drains/Airways Status   Active Line/Drains/Airways    Name:   Placement date:   Placement time:   Site:   Days:   Peripheral IV 02/05/18 Right;Lateral;Distal Forearm   02/05/18    1350    Forearm   less than 1          Labs/Imaging Results for orders placed or performed during the hospital encounter of 02/05/18 (from the past 48 hour(s))  Urinalysis, Routine w reflex microscopic     Status: Abnormal   Collection Time: 02/05/18  1:35 PM  Result Value Ref Range   Color, Urine YELLOW YELLOW   APPearance HAZY (A) CLEAR   Specific Gravity, Urine 1.023 1.005 - 1.030   pH 5.0 5.0 - 8.0   Glucose, UA NEGATIVE NEGATIVE mg/dL   Hgb urine dipstick MODERATE (A) NEGATIVE   Bilirubin Urine NEGATIVE NEGATIVE   Ketones, ur 5 (A) NEGATIVE mg/dL   Protein, ur NEGATIVE NEGATIVE mg/dL   Nitrite NEGATIVE NEGATIVE   Leukocytes, UA NEGATIVE NEGATIVE   RBC / HPF 21-50 0 - 5 RBC/hpf   WBC, UA 0-5 0  - 5 WBC/hpf   Bacteria, UA NONE SEEN NONE SEEN   Squamous Epithelial / LPF 0-5 0 - 5    Comment: Please note change in reference range.   Mucus PRESENT     Comment: Performed at [facility], [place] [address], [place], [place] [address]  POC occult blood, ED     Status: Abnormal   Collection Time: 02/05/18  1:49 PM  Result Value Ref Range   Fecal Occult Bld POSITIVE (A) NEGATIVE  Comprehensive metabolic panel     Status: Abnormal   Collection Time: 02/05/18  1:56 PM  Result Value Ref Range   Sodium 142 135 - 145 mmol/L   Potassium 3.2 (L) 3.5 - 5.1 mmol/L   Chloride 106 101 - 111 mmol/L   CO2 22 22 - 32 mmol/L   Glucose, Bld 93 65 - 99 mg/dL   BUN 15 6 - 20 mg/dL   Creatinine, Ser 0.66 0.44 - 1.00 mg/dL   Calcium 9.1 8.9 - 10.3 mg/dL   Total Protein 7.5 6.5 - 8.1 g/dL   Albumin 3.9 3.5 - 5.0 g/dL   AST 28 15 - 41 U/L   ALT 20 14 - 54 U/L   Alkaline Phosphatase 79 38 - 126 U/L   Total Bilirubin 0.6 0.3 - 1.2 mg/dL   GFR calc non Af Amer >60 >60 mL/min  GFR calc Af Amer >60 >60 mL/min    Comment: (NOTE) The eGFR has been calculated using the CKD EPI equation. This calculation has not been validated in all clinical situations. eGFR's persistently <60 mL/min signify possible Chronic Kidney Disease.    Anion gap 14 5 - 15    Comment: Performed at [facility], [place] [address] [place], [place] [address]  CBC     Status: Abnormal   Collection Time: 02/05/18  1:56 PM  Result Value Ref Range   WBC 18.7 (H) 4.0 - 10.5 K/uL   RBC 4.29 3.87 - 5.11 MIL/uL   Hemoglobin 13.0 12.0 - 15.0 g/dL   HCT 39.2 36.0 - 46.0 %   MCV 91.4 78.0 - 100.0 fL   MCH 30.3 26.0 - 34.0 pg   MCHC 33.2 30.0 - 36.0 g/dL   RDW 14.3 11.5 - 15.5 %   Platelets 277 150 - 400 K/uL    Comment: Performed at [facility], [place] [address], [place], [place] [address]  Type and screen [place]     Status: None (Preliminary result)    Collection Time: 02/05/18  1:56 PM  Result Value Ref Range   ABO/RH(D) B NEG    Antibody Screen PENDING    Sample Expiration      02/08/2018 Performed at [facility], [place] [address] [place], [place] [address]    Ct Abdomen Pelvis Wo Contrast  Result Date: 02/05/2018 CLINICAL DATA:  Patient states she began having mid abdominal pain, vomiting, and diarrhea yesterday. Today she states she saw bright red blood in her diarrhea this AM x3. EXAM: CT ABDOMEN AND PELVIS WITHOUT CONTRAST TECHNIQUE: Multidetector CT imaging of the abdomen and pelvis was performed following the standard protocol without IV contrast. COMPARISON:  None. FINDINGS: Lower chest: No acute abnormality. Hepatobiliary: Liver mildly enlarged measuring 23 cm in greatest transverse dimension. There is diffuse decreased attenuation of the liver consistent with fatty infiltration. No liver mass or focal lesion. Gallbladder is unremarkable. No bile duct dilation. Pancreas: Unremarkable. No pancreatic ductal dilatation or surrounding inflammatory changes. Spleen: Normal in size without focal abnormality. Adrenals/Urinary Tract: No adrenal masses. Kidneys are normal in size, orientation and position. Possible mild left hydronephrosis versus multiple renal sinus cysts. The latter is favored given that the left ureter is normal course and caliber. There are no intrarenal or ureteral stones. No renal masses. Right renal collecting system is nondistended. Bladder is unremarkable. Stomach/Bowel: There is hazy inflammatory change in the pericolonic fat extending from the left transverse colon across the splenic flexure to the lower descending colon. There are several descending colon diverticula. This portion of the colon is decompressed. Wall thickening is suspected. The extent of involvement is greater than expected for diverticulitis. Findings are consistent with infectious or inflammatory colitis. There are other scattered colonic  diverticula. No other inflammatory changes. Stomach and small bowel are unremarkable.  Appendix not visualized. Vascular/Lymphatic: No significant vascular findings are present. No enlarged abdominal or pelvic lymph nodes. Reproductive: Uterus and bilateral adnexa are unremarkable. Other: Small fat containing umbilical hernia. No other hernias. No ascites. Musculoskeletal: No fracture or acute finding. Small bone island in the left ischium. No osteolytic lesions. IMPRESSION: 1. Infectious or inflammatory colitis involving the left colon from the left transverse colon to the lower descending colon. 2. No other acute abnormality. 3. Mild hepatomegaly.  Diffuse hepatic steatosis. 4. Scattered colonic diverticula.  No convincing diverticulitis. Electronically Signed   By:  Lajean Manes M.D.   On: 02/05/2018 14:46    Pending Labs Unresulted Labs (From admission, onward)   Start     Ordered   02/05/18 1656  Gastrointestinal Panel by PCR , Stool  (Gastrointestinal Panel by PCR, Stool)  Once,   R     02/05/18 1655   02/05/18 1356  ABO/Rh  Once,   R     02/05/18 1356   Signed and Held  Basic metabolic panel  Tomorrow morning,   R     Signed and Held   Signed and Held  CBC  Tomorrow morning,   R     Signed and Held      Vitals/Pain Today's Vitals   02/05/18 1337 02/05/18 1536 02/05/18 1536 02/05/18 1624  BP: (!) 174/70   (!) 173/76  Pulse: 80   60  Resp: 20   20  Temp:      TempSrc:      SpO2: 100%   99%  Weight:      Height:      PainSc:  3  3      Isolation Precautions Enteric precautions (UV disinfection)  Medications Medications  cefTRIAXone (ROCEPHIN) 2 g in sodium chloride 0.9 % 100 mL IVPB (0 g Intravenous Stopped 02/05/18 1655)    And  metroNIDAZOLE (FLAGYL) IVPB 500 mg (500 mg Intravenous New Bag/Given 02/05/18 1617)  hydrALAZINE (APRESOLINE) injection 10 mg (has no administration in time range)  amLODipine (NORVASC) tablet 5 mg (has no administration in time range)  ondansetron  (ZOFRAN) injection 4 mg (4 mg Intravenous Given 02/05/18 1357)  sodium chloride 0.9 % bolus 500 mL (0 mLs Intravenous Stopped 02/05/18 1458)  morphine 4 MG/ML injection 4 mg (4 mg Intravenous Given 02/05/18 1357)  morphine 2 MG/ML injection 2 mg (2 mg Intravenous Given 02/05/18 1617)    Mobility walks with person assist

## 2018-02-06 ENCOUNTER — Encounter (HOSPITAL_COMMUNITY): Payer: Self-pay

## 2018-02-06 LAB — CBC
HEMATOCRIT: 33.9 % — AB (ref 36.0–46.0)
HEMOGLOBIN: 11 g/dL — AB (ref 12.0–15.0)
MCH: 29.9 pg (ref 26.0–34.0)
MCHC: 32.4 g/dL (ref 30.0–36.0)
MCV: 92.1 fL (ref 78.0–100.0)
Platelets: 242 10*3/uL (ref 150–400)
RBC: 3.68 MIL/uL — AB (ref 3.87–5.11)
RDW: 14.5 % (ref 11.5–15.5)
WBC: 15.5 10*3/uL — ABNORMAL HIGH (ref 4.0–10.5)

## 2018-02-06 LAB — BASIC METABOLIC PANEL
ANION GAP: 6 (ref 5–15)
BUN: 12 mg/dL (ref 6–20)
CALCIUM: 8.3 mg/dL — AB (ref 8.9–10.3)
CHLORIDE: 111 mmol/L (ref 101–111)
CO2: 25 mmol/L (ref 22–32)
Creatinine, Ser: 0.67 mg/dL (ref 0.44–1.00)
GFR calc non Af Amer: >60 mL/min (ref >60)
GLUCOSE: 96 mg/dL (ref 65–99)
Potassium: 3.5 mmol/L (ref 3.5–5.1)
Sodium: 142 mmol/L (ref 135–145)

## 2018-02-06 LAB — ABO/RH: ABO/RH(D): B NEG

## 2018-02-06 MED ORDER — MORPHINE SULFATE (PF) 4 MG/ML IV SOLN
2.0000 mg | INTRAVENOUS | Status: DC | PRN
  Administered 2018-02-06 – 2018-02-07 (×6): 2 mg via INTRAVENOUS
  Filled 2018-02-06 (×6): qty 1

## 2018-02-06 NOTE — Progress Notes (Signed)
PROGRESS NOTE    Destiny Wheeler  ZOX:[id number] DOB: 20-Feb-1951 DOA: 02/05/2018 PCP: Patient, No Pcp Per    Brief Narrative:  Patient is a 67 year old female with no significant past medical history presented to ED with nausea, vomiting, abdominal pain and diarrhea since 2 days. CT abd shows colitis involving the left colon.  She was admitted for IV antibiotics.     Assessment & Plan:   Principal Problem:   Acute colitis Active Problems:   Rectal bleeding   Accelerated hypertension   Hypokalemia   ACute colitis: Inflammatory vs infectious.  GI pathogen to be sent once she has a BM. Continue with IV fluids, IV antibiotics, IV anti emetics.  Advance diet to full liquids.  Follow up with GI as outpatient.  Afebrile and improving wbc count.    Rectal bleeding: appears to have resolved.    Hypertension: well controlled.   Anemia: Normocytic, prob secondary to acute anemia of blood loss from rectal bleed and in addition to hemodilution.  Repeat cbc in am.   Hypokalemia: replaced.      DVT prophylaxis: scd's Code Status: full code.  Family Communication: family at bedside.  Disposition Plan: home when able to tolerate soft diet.    Consultants:   None.   Procedures: none.    Antimicrobials: flagyl and ciprofloxacin.    Subjective: abd pain better requesting to advance her diet. Nausea still present, no vomiting today. No BM today yet  Objective: Vitals:   02/05/18 1743 02/05/18 2051 02/06/18 0619 02/06/18 1347  BP: (!) 152/65 130/60 131/62 (!) 117/54  Pulse: (!) 51 62 (!) 57 (!) 58  Resp: Temp: 98.3 F (36.8 C) 98.1 F (36.7 C) 97.9 F (36.6 C) 97.9 F (36.6 C)  TempSrc: Oral Oral Oral Oral  SpO2: 98% 96% 93% 99%  Weight:      Height:        Intake/Output Summary (Last 24 hours) at 02/06/2018 1640 Last data filed at 02/06/2018 1000 Gross per 24 hour  Intake 2571.67 ml  Output 500 ml  Net 2071.67 ml   Filed Weights   02/05/18 1232 02/05/18 1739  Weight: 102.5 kg (226 lb) 104.7 kg (230 lb 13.2 oz)    Examination:  General exam: Appears calm and comfortable  Respiratory system: Clear to auscultation. Respiratory effort normal. Cardiovascular system: S1 & S2 heard, RRR. No JVD, murmurs, rubs, gallops or clicks. No pedal edema. Gastrointestinal system: Abdomen is soft, mildly tender generalized. Bowel sounds heard.  Central nervous system: Alert and oriented. No focal neurological deficits. Extremities: Symmetric 5 x 5 power. Skin: No rashes, lesions or ulcers Psychiatry: Mood & affect appropriate.     Data Reviewed: I have personally reviewed following labs and imaging studies  CBC: Recent Labs  Lab 02/05/18 1356 02/06/18 0531  WBC 18.7* 15.5*  HGB 13.0 11.0*  HCT 39.2 33.9*  MCV 91.4 92.1  PLT 277 242   Basic Metabolic Panel: Recent Labs  Lab 02/05/18 1356 02/06/18 0531  NA 142 142  K 3.2* 3.5  CL 106 111  CO2 22 25  GLUCOSE 93 96  BUN 15 12  CREATININE 0.66 0.67  CALCIUM 9.1 8.3*   GFR: Estimated Creatinine Clearance: 85.2 mL/min (by C-G formula based on SCr of 0.67 mg/dL). Liver Function Tests: Recent Labs  Lab 02/05/18 1356  AST 28  ALT 20  ALKPHOS 79  BILITOT 0.6  PROT 7.5  ALBUMIN 3.9   No results for input(s): LIPASE,  AMYLASE in the last 168 hours. No results for input(s): AMMONIA in the last 168 hours. Coagulation Profile: No results for input(s): INR, PROTIME in the last 168 hours. Cardiac Enzymes: No results for input(s): CKTOTAL, CKMB, CKMBINDEX, TROPONINI in the last 168 hours. BNP (last 3 results) No results for input(s): PROBNP in the last 8760 hours. HbA1C: No results for input(s): HGBA1C in the last 72 hours. CBG: No results for input(s): GLUCAP in the last 168 hours. Lipid Profile: No results for input(s): CHOL, HDL, LDLCALC, TRIG, CHOLHDL, LDLDIRECT in the last 72 hours. Thyroid Function Tests: No results for input(s): TSH, T4TOTAL, FREET4,  T3FREE, THYROIDAB in the last 72 hours. Anemia Panel: No results for input(s): VITAMINB12, FOLATE, FERRITIN, TIBC, IRON, RETICCTPCT in the last 72 hours. Sepsis Labs: No results for input(s): PROCALCITON, LATICACIDVEN in the last 168 hours.  No results found for this or any previous visit (from the past 240 hour(s)).       Radiology Studies: Ct Abdomen Pelvis Wo Contrast  Result Date: 02/05/2018 CLINICAL DATA:  Patient states she began having mid abdominal pain, vomiting, and diarrhea yesterday. Today she states she saw bright red blood in her diarrhea this AM x3. EXAM: CT ABDOMEN AND PELVIS WITHOUT CONTRAST TECHNIQUE: Multidetector CT imaging of the abdomen and pelvis was performed following the standard protocol without IV contrast. COMPARISON:  None. FINDINGS: Lower chest: No acute abnormality. Hepatobiliary: Liver mildly enlarged measuring 23 cm in greatest transverse dimension. There is diffuse decreased attenuation of the liver consistent with fatty infiltration. No liver mass or focal lesion. Gallbladder is unremarkable. No bile duct dilation. Pancreas: Unremarkable. No pancreatic ductal dilatation or surrounding inflammatory changes. Spleen: Normal in size without focal abnormality. Adrenals/Urinary Tract: No adrenal masses. Kidneys are normal in size, orientation and position. Possible mild left hydronephrosis versus multiple renal sinus cysts. The latter is favored given that the left ureter is normal course and caliber. There are no intrarenal or ureteral stones. No renal masses. Right renal collecting system is nondistended. Bladder is unremarkable. Stomach/Bowel: There is hazy inflammatory change in the pericolonic fat extending from the left transverse colon across the splenic flexure to the lower descending colon. There are several descending colon diverticula. This portion of the colon is decompressed. Wall thickening is suspected. The extent of involvement is greater than expected for  diverticulitis. Findings are consistent with infectious or inflammatory colitis. There are other scattered colonic diverticula. No other inflammatory changes. Stomach and small bowel are unremarkable.  Appendix not visualized. Vascular/Lymphatic: No significant vascular findings are present. No enlarged abdominal or pelvic lymph nodes. Reproductive: Uterus and bilateral adnexa are unremarkable. Other: Small fat containing umbilical hernia. No other hernias. No ascites. Musculoskeletal: No fracture or acute finding. Small bone island in the left ischium. No osteolytic lesions. IMPRESSION: 1. Infectious or inflammatory colitis involving the left colon from the left transverse colon to the lower descending colon. 2. No other acute abnormality. 3. Mild hepatomegaly.  Diffuse hepatic steatosis. 4. Scattered colonic diverticula.  No convincing diverticulitis. Electronically Signed   By: Amie Portland M.D.   On: 02/05/2018 14:46        Scheduled Meds: . amLODipine  5 mg Oral Daily   Continuous Infusions: . sodium chloride 100 mL/hr at 02/06/18 0602  . ciprofloxacin Stopped (02/06/18 1610)  . metronidazole 500 mg (02/06/18 1524)     LOS: 1 day    Time spent: 25 minutes.     Kathlen Mody, MD [facility] Pager [phone]  If 7PM-7AM,  please contact night-coverage [email or web address] Password [facility] 02/06/2018, 4:40 PM

## 2018-02-07 ENCOUNTER — Encounter (HOSPITAL_COMMUNITY): Payer: Self-pay

## 2018-02-07 MED ORDER — DOCUSATE SODIUM 100 MG PO CAPS: 100.0000 mg | ORAL_CAPSULE | Freq: Every day | ORAL | Status: DC | PRN

## 2018-02-07 MED ORDER — TRAMADOL HCL 50 MG PO TABS
50.0000 mg | ORAL_TABLET | Freq: Four times a day (QID) | ORAL | Status: DC | PRN
  Administered 2018-02-07 – 2018-02-08 (×3): 50 mg via ORAL
  Filled 2018-02-07 (×3): qty 1

## 2018-02-07 NOTE — Progress Notes (Signed)
PROGRESS NOTE    Destiny Wheeler  ZOX:[id number] DOB: 19-Oct-1950 DOA: 02/05/2018 PCP: Patient, No Pcp Per    Brief Narrative:  Patient is a 67 year old female with no significant past medical history presented to ED with nausea, vomiting, abdominal pain and diarrhea since 2 days. CT abd shows colitis involving the left colon.  She was admitted for IV antibiotics.     Assessment & Plan:   Principal Problem:   Acute colitis Active Problems:   Rectal bleeding   Accelerated hypertension   Hypokalemia   ACute colitis: Inflammatory vs infectious.  GI pathogen to be sent once she has a BM. Continue with IV fluids, IV antibiotics, IV anti emetics.  Able to tolerate full liquids, advance to soft diet and  Follow up with GI as outpatient once the antibiotics are completed.  Afebrile and improving wbc count.    Rectal bleeding: appears to have resolved.    Hypertension: well controlled.   Anemia: Normocytic, prob secondary to acute anemia of blood loss from rectal bleed and in addition to hemodilution.  Repeat cbc pending.   Hypokalemia: replaced.      DVT prophylaxis: scd's Code Status: full code.  Family Communication: family at bedside.  Disposition Plan: home when able to tolerate soft diet.    Consultants:   None.   Procedures: none.    Antimicrobials: flagyl and ciprofloxacin.    Subjective: abd pain better, no BM yet .   Objective: Vitals:   02/06/18 2205 02/07/18 0452 02/07/18 0822 02/07/18 1408  BP: (!) 126/51 124/67 (!) 130/56 (!) 145/57  Pulse: 63 (!) 51  66  Resp: Temp: 97.8 F (36.6 C) 98.2 F (36.8 C)  98.3 F (36.8 C)  TempSrc: Oral Oral  Oral  SpO2: 96% 98%  97%  Weight:      Height:        Intake/Output Summary (Last 24 hours) at 02/07/2018 1427 Last data filed at 02/07/2018 1400 Gross per 24 hour  Intake 3500 ml  Output 1500 ml  Net 2000 ml   Filed Weights   02/05/18 1232 02/05/18 1739  Weight: 102.5 kg (226  lb) 104.7 kg (230 lb 13.2 oz)    Examination:  General exam: Appears calm and comfortable , not in distress  Respiratory system: Clear to auscultation. Respiratory effort normal. No wheezing or rhonchi.  Cardiovascular system: S1 & S2 heard, RRR. No JVD, murmurs,  Gastrointestinal system: Abdomen is soft, mild lower quadrant tenderness, bowel sounds heard.  Central nervous system: Alert and oriented.non focal.  Extremities: Symmetric 5 x 5 power. No cyanosis or clubbing.  Skin: No rashes, lesions or ulcers Psychiatry: Mood & affect appropriate.     Data Reviewed: I have personally reviewed following labs and imaging studies  CBC: Recent Labs  Lab 02/05/18 1356 02/06/18 0531  WBC 18.7* 15.5*  HGB 13.0 11.0*  HCT 39.2 33.9*  MCV 91.4 92.1  PLT 277 242   Basic Metabolic Panel: Recent Labs  Lab 02/05/18 1356 02/06/18 0531  NA 142 142  K 3.2* 3.5  CL 106 111  CO2 22 25  GLUCOSE 93 96  BUN 15 12  CREATININE 0.66 0.67  CALCIUM 9.1 8.3*   GFR: Estimated Creatinine Clearance: 85.2 mL/min (by C-G formula based on SCr of 0.67 mg/dL). Liver Function Tests: Recent Labs  Lab 02/05/18 1356  AST 28  ALT 20  ALKPHOS 79  BILITOT 0.6  PROT 7.5  ALBUMIN 3.9   No results  for input(s): LIPASE, AMYLASE in the last 168 hours. No results for input(s): AMMONIA in the last 168 hours. Coagulation Profile: No results for input(s): INR, PROTIME in the last 168 hours. Cardiac Enzymes: No results for input(s): CKTOTAL, CKMB, CKMBINDEX, TROPONINI in the last 168 hours. BNP (last 3 results) No results for input(s): PROBNP in the last 8760 hours. HbA1C: No results for input(s): HGBA1C in the last 72 hours. CBG: No results for input(s): GLUCAP in the last 168 hours. Lipid Profile: No results for input(s): CHOL, HDL, LDLCALC, TRIG, CHOLHDL, LDLDIRECT in the last 72 hours. Thyroid Function Tests: No results for input(s): TSH, T4TOTAL, FREET4, T3FREE, THYROIDAB in the last 72  hours. Anemia Panel: No results for input(s): VITAMINB12, FOLATE, FERRITIN, TIBC, IRON, RETICCTPCT in the last 72 hours. Sepsis Labs: No results for input(s): PROCALCITON, LATICACIDVEN in the last 168 hours.  No results found for this or any previous visit (from the past 240 hour(s)).       Radiology Studies: No results found.      Scheduled Meds: . amLODipine  5 mg Oral Daily   Continuous Infusions: . sodium chloride 100 mL/hr at 02/07/18 0553  . ciprofloxacin 400 mg (02/07/18 0553)  . metronidazole Stopped (02/07/18 0933)     LOS: 2 days    Time spent: 25 minutes.     Kathlen Mody, MD [facility] Pager [phone]  If 7PM-7AM, please contact night-coverage [email or web address] Password [facility] 02/07/2018, 2:27 PM

## 2018-02-07 NOTE — Care Management Note (Signed)
Case Management Note  Patient Details  Name: Destiny Wheeler MRN: [id number] Date of Birth: 1951/02/25  Subjective/Objective:66 y/o f admitted w/Acute colitis. From home.                    Action/Plan:d/c home.   Expected Discharge Date:                  Expected Discharge Plan:  Home/Self Care  In-House Referral:     Discharge planning Services  CM Consult  Post Acute Care Choice:    Choice offered to:     DME Arranged:    DME Agency:     HH Arranged:    HH Agency:     Status of Service:  In process, will continue to follow  If discussed at Long Length of Stay Meetings, dates discussed:    Additional Comments:  Lanier Clam, RN 02/07/2018, 12:01 PM

## 2018-02-08 LAB — GASTROINTESTINAL PANEL BY PCR, STOOL (REPLACES STOOL CULTURE)

## 2018-02-08 LAB — CBC
HEMATOCRIT: 32.6 % — AB (ref 36.0–46.0)
Hemoglobin: 10.6 g/dL — ABNORMAL LOW (ref 12.0–15.0)
MCH: 30 pg (ref 26.0–34.0)
MCHC: 32.5 g/dL (ref 30.0–36.0)
MCV: 92.4 fL (ref 78.0–100.0)
PLATELETS: 243 10*3/uL (ref 150–400)
RBC: 3.53 MIL/uL — ABNORMAL LOW (ref 3.87–5.11)
RDW: 14.3 % (ref 11.5–15.5)
WBC: 10.4 10*3/uL (ref 4.0–10.5)

## 2018-02-08 MED ORDER — ONDANSETRON HCL 4 MG PO TABS: 4.0000 mg | ORAL_TABLET | Freq: Four times a day (QID) | ORAL | 0 refills | Status: DC | PRN

## 2018-02-08 MED ORDER — METRONIDAZOLE 500 MG PO TABS: 500.0000 mg | ORAL_TABLET | Freq: Three times a day (TID) | ORAL | 0 refills | Status: AC

## 2018-02-08 MED ORDER — CIPROFLOXACIN HCL 500 MG PO TABS: 500.0000 mg | ORAL_TABLET | Freq: Two times a day (BID) | ORAL | 0 refills | Status: DC

## 2018-02-08 MED ORDER — TRAMADOL HCL 50 MG PO TABS: 50.0000 mg | ORAL_TABLET | Freq: Four times a day (QID) | ORAL | 0 refills | Status: AC | PRN

## 2018-02-08 NOTE — Care Management Important Message (Signed)
Important Message  Patient Details  Name: Destiny Wheeler MRN: [id number] Date of Birth: [birth year]   Medicare Important Message Given:  Yes    Caren Macadam 02/08/2018, 10:33 AMImportant Message  Patient Details  Name: Destiny Wheeler MRN: [id number] Date of Birth: [birth year]   Medicare Important Message Given:  Yes    Caren Macadam 02/08/2018, 10:33 AM

## 2018-02-08 NOTE — Discharge Summary (Signed)
Physician Discharge Summary  Destiny Wheeler ZOX:[id number] DOB: [birth year] DOA: 02/05/2018  PCP: Patient, No Pcp Per  Admit date: 02/05/2018 Discharge date: 02/08/2018  Admitted From: Home.  Disposition: Home   Recommendations for Outpatient Follow-up:  1. Follow up with PCP in 1-2 weeks 2. Please obtain BMP/CBC in one week     Discharge Condition:stable.  CODE STATUS: full code.  Diet recommendation: Heart Healthy    Brief/Interim Summary: Patient is a 67 year old female with no significant past medical history presented to ED with nausea, vomiting, abdominal pain and diarrhea since 2 days. CT abd shows colitis involving the left colon.  She was admitted for IV antibiotics.     Discharge Diagnoses:  Principal Problem:   Acute colitis Active Problems:   Rectal bleeding   Accelerated hypertension   Hypokalemia  ACute colitis: Inflammatory vs infectious.  She was started on IV fluids and IV antibiotics. She was started liquid diet, and as she was Able to tolerate full liquids, advance to soft diet and  Follow up with GI as outpatient once the antibiotics are completed.  Afebrile and improving wbc count.    Rectal bleeding: appears to have resolved.    Hypertension: well controlled.   Anemia: Normocytic, prob secondary to acute anemia of blood loss from rectal bleed and in addition to hemodilution.  Repeat cbc shows hemoglobin around 10.6. Recommend outpatient follow up with gastroenterology in 2 weeks for colonoscopy after colitis has resolved. A referral to gastroenterology given.   Hypokalemia: replaced.      Discharge Instructions  Discharge Instructions    Diet - low sodium heart healthy   Complete by:  As directed    Discharge instructions   Complete by:  As directed    please follow up with gastroenterology in one week. Please call to make appointment.  Please follow up with PCP by the end of the week.     Allergies as of 02/08/2018    Reactions   Iodine    Reports face swelling from IV contrast      Medication List    STOP taking these medications   cyclobenzaprine 10 MG tablet Commonly known as:  FLEXERIL     TAKE these medications   acetaminophen 500 MG tablet Commonly known as:  TYLENOL Take 500 mg by mouth every 4 (four) hours as needed for moderate pain.   amLODipine-benazepril 5-10 MG capsule Commonly known as:  LOTREL Take 1 capsule by mouth daily.   CENTRUM SILVER 50+WOMEN PO Take 1 tablet by mouth daily.   ciprofloxacin 500 MG tablet Commonly known as:  CIPRO Take 1 tablet (500 mg total) by mouth 2 (two) times daily.   metroNIDAZOLE 500 MG tablet Commonly known as:  FLAGYL Take 1 tablet (500 mg total) by mouth 3 (three) times daily for 7 days.   omeprazole 20 MG capsule Commonly known as:  PRILOSEC Take 1 capsule (20 mg total) by mouth daily.   ondansetron 4 MG tablet Commonly known as:  ZOFRAN Take 1 tablet (4 mg total) by mouth every 6 (six) hours as needed for nausea.   traMADol 50 MG tablet Commonly known as:  ULTRAM Take 1 tablet (50 mg total) by mouth every 6 (six) hours as needed for up to 3 days for moderate pain (use tramadol first and then if not controlled use vicodin.).      Follow-up Information    Gastroenterology, Deboraha Sprang. Schedule an appointment as soon as possible for a visit in 1 week(s).  Why:  for colitis. will need colonoscopy.  Contact information: [address] [place] [address] [phone]          Allergies  Allergen Reactions  . Iodine     Reports face swelling from IV contrast    Consultations:  None.    Procedures/Studies: Ct Abdomen Pelvis Wo Contrast  Result Date: 02/05/2018 CLINICAL DATA:  Patient states she began having mid abdominal pain, vomiting, and diarrhea yesterday. Today she states she saw bright red blood in her diarrhea this AM x3. EXAM: CT ABDOMEN AND PELVIS WITHOUT CONTRAST TECHNIQUE: Multidetector CT imaging  of the abdomen and pelvis was performed following the standard protocol without IV contrast. COMPARISON:  None. FINDINGS: Lower chest: No acute abnormality. Hepatobiliary: Liver mildly enlarged measuring 23 cm in greatest transverse dimension. There is diffuse decreased attenuation of the liver consistent with fatty infiltration. No liver mass or focal lesion. Gallbladder is unremarkable. No bile duct dilation. Pancreas: Unremarkable. No pancreatic ductal dilatation or surrounding inflammatory changes. Spleen: Normal in size without focal abnormality. Adrenals/Urinary Tract: No adrenal masses. Kidneys are normal in size, orientation and position. Possible mild left hydronephrosis versus multiple renal sinus cysts. The latter is favored given that the left ureter is normal course and caliber. There are no intrarenal or ureteral stones. No renal masses. Right renal collecting system is nondistended. Bladder is unremarkable. Stomach/Bowel: There is hazy inflammatory change in the pericolonic fat extending from the left transverse colon across the splenic flexure to the lower descending colon. There are several descending colon diverticula. This portion of the colon is decompressed. Wall thickening is suspected. The extent of involvement is greater than expected for diverticulitis. Findings are consistent with infectious or inflammatory colitis. There are other scattered colonic diverticula. No other inflammatory changes. Stomach and small bowel are unremarkable.  Appendix not visualized. Vascular/Lymphatic: No significant vascular findings are present. No enlarged abdominal or pelvic lymph nodes. Reproductive: Uterus and bilateral adnexa are unremarkable. Other: Small fat containing umbilical hernia. No other hernias. No ascites. Musculoskeletal: No fracture or acute finding. Small bone island in the left ischium. No osteolytic lesions. IMPRESSION: 1. Infectious or inflammatory colitis involving the left colon from the  left transverse colon to the lower descending colon. 2. No other acute abnormality. 3. Mild hepatomegaly.  Diffuse hepatic steatosis. 4. Scattered colonic diverticula.  No convincing diverticulitis. Electronically Signed   By: Amie Portland M.D.   On: 02/05/2018 14:46       Subjective: No complaints.   Discharge Exam: Vitals:   02/07/18 2303 02/08/18 0632  BP: (!) 144/58 134/69  Pulse: 60 62  Resp: 20 20  Temp: 98.3 F (36.8 C) 97.6 F (36.4 C)  SpO2: 96% 95%   Vitals:   02/07/18 0822 02/07/18 1408 02/07/18 2303 02/08/18 0632  BP: (!) 130/56 (!) 145/57 (!) 144/58 134/69  Pulse:  66 60 62  Resp:  Temp:  98.3 F (36.8 C) 98.3 F (36.8 C) 97.6 F (36.4 C)  TempSrc:  Oral Oral Oral  SpO2:  97% 96% 95%  Weight:      Height:        General: Pt is alert, awake, not in acute distress Cardiovascular: RRR, S1/S2 +, no rubs, no gallops Respiratory: CTA bilaterally, no wheezing, no rhonchi Abdominal: Soft, NT, ND, bowel sounds + Extremities: no edema, no cyanosis    The results of significant diagnostics from this hospitalization (including imaging, microbiology, ancillary and laboratory) are listed below for  reference.     Microbiology: Recent Results (from the past 240 hour(s))  Gastrointestinal Panel by PCR , Stool     Status: None   Collection Time: 02/08/18  1:02 AM  Result Value Ref Range Status   Campylobacter species NOT DETECTED NOT DETECTED Final   Plesimonas shigelloides NOT DETECTED NOT DETECTED Final   Salmonella species NOT DETECTED NOT DETECTED Final   Yersinia enterocolitica NOT DETECTED NOT DETECTED Final   Vibrio species NOT DETECTED NOT DETECTED Final   Vibrio cholerae NOT DETECTED NOT DETECTED Final   Enteroaggregative E coli (EAEC) NOT DETECTED NOT DETECTED Final   Enteropathogenic E coli (EPEC) NOT DETECTED NOT DETECTED Final   Enterotoxigenic E coli (ETEC) NOT DETECTED NOT DETECTED Final   Shiga like toxin producing E coli (STEC) NOT  DETECTED NOT DETECTED Final   Shigella/Enteroinvasive E coli (EIEC) NOT DETECTED NOT DETECTED Final   Cryptosporidium NOT DETECTED NOT DETECTED Final   Cyclospora cayetanensis NOT DETECTED NOT DETECTED Final   Entamoeba histolytica NOT DETECTED NOT DETECTED Final   Giardia lamblia NOT DETECTED NOT DETECTED Final   Adenovirus F40/41 NOT DETECTED NOT DETECTED Final   Astrovirus NOT DETECTED NOT DETECTED Final   Norovirus GI/GII NOT DETECTED NOT DETECTED Final   Rotavirus A NOT DETECTED NOT DETECTED Final   Sapovirus (I, II, IV, and V) NOT DETECTED NOT DETECTED Final    Comment: Performed at [facility], [address] [place], [place] [address]     Labs: BNP (last 3 results) No results for input(s): BNP in the last 8760 hours. Basic Metabolic Panel: Recent Labs  Lab 02/05/18 1356 02/06/18 0531  NA 142 142  K 3.2* 3.5  CL 106 111  CO2 22 25  GLUCOSE 93 96  BUN 15 12  CREATININE 0.66 0.67  CALCIUM 9.1 8.3*   Liver Function Tests: Recent Labs  Lab 02/05/18 1356  AST 28  ALT 20  ALKPHOS 79  BILITOT 0.6  PROT 7.5  ALBUMIN 3.9   No results for input(s): LIPASE, AMYLASE in the last 168 hours. No results for input(s): AMMONIA in the last 168 hours. CBC: Recent Labs  Lab 02/05/18 1356 02/06/18 0531 02/08/18 0551  WBC 18.7* 15.5* 10.4  HGB 13.0 11.0* 10.6*  HCT 39.2 33.9* 32.6*  MCV 91.4 92.1 92.4  PLT 277 242 243   Cardiac Enzymes: No results for input(s): CKTOTAL, CKMB, CKMBINDEX, TROPONINI in the last 168 hours. BNP: Invalid input(s): POCBNP CBG: No results for input(s): GLUCAP in the last 168 hours. D-Dimer No results for input(s): DDIMER in the last 72 hours. Hgb A1c No results for input(s): HGBA1C in the last 72 hours. Lipid Profile No results for input(s): CHOL, HDL, LDLCALC, TRIG, CHOLHDL, LDLDIRECT in the last 72 hours. Thyroid function studies No results for input(s): TSH, T4TOTAL, T3FREE, THYROIDAB in the last 72 hours.  Invalid  input(s): FREET3 Anemia work up No results for input(s): VITAMINB12, FOLATE, FERRITIN, TIBC, IRON, RETICCTPCT in the last 72 hours. Urinalysis    Component Value Date/Time   COLORURINE YELLOW 02/05/2018 1335   APPEARANCEUR HAZY (A) 02/05/2018 1335   LABSPEC 1.023 02/05/2018 1335   PHURINE 5.0 02/05/2018 1335   GLUCOSEU NEGATIVE 02/05/2018 1335   HGBUR MODERATE (A) 02/05/2018 1335   BILIRUBINUR NEGATIVE 02/05/2018 1335   KETONESUR 5 (A) 02/05/2018 1335   PROTEINUR NEGATIVE 02/05/2018 1335   NITRITE NEGATIVE 02/05/2018 1335   LEUKOCYTESUR NEGATIVE 02/05/2018 1335   Sepsis Labs Invalid input(s): PROCALCITONIN,  WBC,  LACTICIDVEN  Microbiology Recent Results (from the past 240 hour(s))  Gastrointestinal Panel by PCR , Stool     Status: None   Collection Time: 02/08/18  1:02 AM  Result Value Ref Range Status   Campylobacter species NOT DETECTED NOT DETECTED Final   Plesimonas shigelloides NOT DETECTED NOT DETECTED Final   Salmonella species NOT DETECTED NOT DETECTED Final   Yersinia enterocolitica NOT DETECTED NOT DETECTED Final   Vibrio species NOT DETECTED NOT DETECTED Final   Vibrio cholerae NOT DETECTED NOT DETECTED Final   Enteroaggregative E coli (EAEC) NOT DETECTED NOT DETECTED Final   Enteropathogenic E coli (EPEC) NOT DETECTED NOT DETECTED Final   Enterotoxigenic E coli (ETEC) NOT DETECTED NOT DETECTED Final   Shiga like toxin producing E coli (STEC) NOT DETECTED NOT DETECTED Final   Shigella/Enteroinvasive E coli (EIEC) NOT DETECTED NOT DETECTED Final   Cryptosporidium NOT DETECTED NOT DETECTED Final   Cyclospora cayetanensis NOT DETECTED NOT DETECTED Final   Entamoeba histolytica NOT DETECTED NOT DETECTED Final   Giardia lamblia NOT DETECTED NOT DETECTED Final   Adenovirus F40/41 NOT DETECTED NOT DETECTED Final   Astrovirus NOT DETECTED NOT DETECTED Final   Norovirus GI/GII NOT DETECTED NOT DETECTED Final   Rotavirus A NOT DETECTED NOT DETECTED Final   Sapovirus (I,  II, IV, and V) NOT DETECTED NOT DETECTED Final    Comment: Performed at [facility], [address] [place], [place] [address]     Time coordinating discharge: 35 minutes  SIGNED:   Kathlen Mody, MD  [facility] 02/08/2018, 1:35 PM Pager   If 7PM-7AM, please contact night-coverage [email or web address] Password [email or web address]

## 2018-06-23 ENCOUNTER — Ambulatory Visit (HOSPITAL_COMMUNITY)
Admission: EM | Admit: 2018-06-23 | Discharge: 2018-06-23 | Disposition: A | Discharge status: Home / Self Care | Payer: Medicare Other | Attending: Family Medicine | Admitting: Family Medicine

## 2018-06-23 ENCOUNTER — Encounter (HOSPITAL_COMMUNITY): Payer: Self-pay

## 2018-06-23 DIAGNOSIS — M7071 Other bursitis of hip, right hip: Secondary | ICD-10-CM

## 2018-06-23 DIAGNOSIS — M79604 Pain in right leg: Secondary | ICD-10-CM | POA: Diagnosis not present

## 2018-06-23 MED ORDER — METHYLPREDNISOLONE 4 MG PO TBPK: ORAL_TABLET | ORAL | 0 refills | Status: DC

## 2018-06-23 MED ORDER — METHYLPREDNISOLONE SODIUM SUCC 125 MG IJ SOLR
INTRAMUSCULAR | Status: AC
  Filled 2018-06-23: qty 2

## 2018-06-23 MED ORDER — KETOROLAC TROMETHAMINE 60 MG/2ML IM SOLN
INTRAMUSCULAR | Status: AC
  Filled 2018-06-23: qty 2

## 2018-06-23 MED ORDER — CYCLOBENZAPRINE HCL 5 MG PO TABS: 5.0000 mg | ORAL_TABLET | Freq: Three times a day (TID) | ORAL | 0 refills | Status: DC | PRN

## 2018-06-23 MED ORDER — METHYLPREDNISOLONE SODIUM SUCC 125 MG IJ SOLR
80.0000 mg | Freq: Once | INTRAMUSCULAR | Status: AC
  Administered 2018-06-23: 80 mg via INTRAMUSCULAR

## 2018-06-23 MED ORDER — KETOROLAC TROMETHAMINE 60 MG/2ML IM SOLN
60.0000 mg | Freq: Once | INTRAMUSCULAR | Status: AC
  Administered 2018-06-23: 60 mg via INTRAMUSCULAR

## 2018-06-23 NOTE — ED Provider Notes (Signed)
[facility]    CSN: [id number] Arrival date & time: 06/23/18  1137     History   Chief Complaint Chief Complaint  Patient presents with  . Back Pain    Right  . Leg Pain    Right    HPI Destiny Wheeler is a 67 y.o. female.   HPI  Pain in the right buttock and hip that extends into the upper thigh. She has never had before.  Cannot identify any new activity or overuse.  No fall.  No history of arthritis or disc disease of the low back . No numbness or weakness of the leg.  Worse with movement, better with the rest and tylenol.  Retired.  Lives with family.   No problems with bowels or bladder, no urinary symptoms.  No fever or chills    History reviewed. No pertinent past medical history.  Patient Active Problem List   Diagnosis Date Noted  . Acute colitis 02/05/2018  . Rectal bleeding 02/05/2018  . Accelerated hypertension 02/05/2018  . Hypokalemia 02/05/2018  . OSTEOARTHROSIS UNSPEC WHETHER GEN/LOCALIZED HAND 10/19/2007  . 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 No pertinent surgical history.  OB History   None      Home Medications    Prior to Admission medications   Medication Sig Start Date End Date Taking? Authorizing Provider  acetaminophen (TYLENOL) 500 MG tablet Take 500 mg by mouth every 4 (four) hours as needed for moderate pain.    [provider]  amLODipine-benazepril (LOTREL) 5-10 MG capsule Take 1 capsule by mouth daily. 01/21/18   [provider]  cyclobenzaprine (FLEXERIL) 5 MG tablet Take 1 tablet (5 mg total) by mouth 3 (three) times daily as needed for muscle spasms. 06/23/18   Eustace Moore, MD  methylPREDNISolone (MEDROL DOSEPAK) 4 MG TBPK tablet tad 06/23/18   Eustace Moore, MD  Multiple Vitamins-Minerals (CENTRUM SILVER 50+WOMEN PO) Take 1 tablet by mouth daily.    [provider]  omeprazole (PRILOSEC) 20 MG capsule Take 1 capsule (20 mg total) by mouth daily. 08/12/16 08/22/16   Shaune Pollack, MD    Family History History reviewed. No pertinent family history. No history arthritis.  Pos hypertension Social History Social History   Tobacco Use  . Smoking status: Never Smoker  . Smokeless tobacco: Never Used  Substance Use Topics  . Alcohol use: No  . Drug use: No     Allergies   Iodine   Review of Systems Review of Systems  Constitutional: Negative for chills and fever.  HENT: Negative for ear pain and sore throat.   Eyes: Negative for pain and visual disturbance.  Respiratory: Negative for cough and shortness of breath.   Cardiovascular: Negative for chest pain and palpitations.  Gastrointestinal: Negative for abdominal pain and vomiting.  Genitourinary: Negative for dysuria and hematuria.  Musculoskeletal: Positive for arthralgias, back pain and gait problem.  Skin: Negative for color change and rash.  Neurological: Negative for seizures and syncope.  Psychiatric/Behavioral: Positive for sleep disturbance. The patient is not nervous/anxious.   All other systems reviewed and are negative.    Physical Exam Triage Vital Signs ED Triage Vitals [06/23/18 1218]  Enc Vitals Group     BP (!) 150/76     Pulse Rate 68     Resp 20     Temp 97.9 F (36.6 C)     Temp Source Oral     SpO2 100 %   No data found.  Updated Vital Signs BP (!) 150/76 (BP Location: Right Arm)   Pulse 68   Temp 97.9 F (36.6 C) (Oral)   Resp 20   SpO2 100%       Physical Exam  Constitutional: She appears well-developed and well-nourished. No distress.  Obese.  Moves slowly.  Limits full weight on the right leg  HENT:  Head: Normocephalic and atraumatic.  Mouth/Throat: Oropharynx is clear and moist.  Eyes: Pupils are equal, round, and reactive to light. Conjunctivae are normal.  Neck: Normal range of motion.  Cardiovascular: Normal rate, regular rhythm and normal heart sounds.  Pulmonary/Chest: Effort normal and breath sounds normal. No respiratory  distress.  Abdominal: Soft. She exhibits no distension.  Musculoskeletal: Normal range of motion. She exhibits no edema.  Tender right SI - mild, - and tenderness right trochanteric and ischial tuberosity.  Strength sensation and reflexes symmetric both LEs. Mild increase in buttock pain with SLR   Neurological: She is alert.  Skin: Skin is warm and dry.  Psychiatric: She has a normal mood and affect. Her behavior is normal.     UC Treatments / Results  Labs (all labs ordered are listed, but only abnormal results are displayed) Labs Reviewed - No data to display  EKG None  Radiology No results found.  Procedures Procedures (including critical care time)  Medications Ordered in UC Medications  ketorolac (TORADOL) injection 60 mg (has no administration in time range)  methylPREDNISolone sodium succinate (SOLU-MEDROL) 125 mg/2 mL injection 80 mg (has no administration in time range)    Initial Impression / Assessment and Plan / UC Course  I have reviewed the triage vital signs and the nursing notes.  Pertinent labs & imaging results that were available during my care of the patient were reviewed by me and considered in my medical decision making (see chart for details).     Discussed x rays not indicated with no trauma.  No findings to suggest bony injury Final Clinical Impressions(s) / UC Diagnoses   Final diagnoses:  Right leg pain  Bursitis of right hip, unspecified bursa     Discharge Instructions     Take the medrol as prescribed Take tylenol for pain Take the cyclobenzaprine as a muscle relaxer Rest See your doctor if not improving by next week   ED Prescriptions    Medication Sig Dispense Auth. Provider   methylPREDNISolone (MEDROL DOSEPAK) 4 MG TBPK tablet tad 21 tablet Eustace MooreNelson, Taj Nevins Sue, MD   cyclobenzaprine (FLEXERIL) 5 MG tablet Take 1 tablet (5 mg total) by mouth 3 (three) times daily as needed for muscle spasms. 30 tablet Eustace MooreNelson, Auria Mckinlay Sue, MD      Controlled Substance Prescriptions [place] Controlled Substance Registry consulted? Not Applicable   Eustace MooreNelson, [place] Sue, MD 06/23/18 1308

## 2018-06-23 NOTE — ED Triage Notes (Signed)
Pt presents with pain in right lower back, hip and leg.

## 2018-06-23 NOTE — Discharge Instructions (Signed)
Take the medrol as prescribed Take tylenol for pain Take the cyclobenzaprine as a muscle relaxer Rest See your doctor if not improving by next week

## 2018-12-13 ENCOUNTER — Ambulatory Visit (HOSPITAL_COMMUNITY)
Admission: EM | Admit: 2018-12-13 | Discharge: 2018-12-13 | Disposition: A | Discharge status: Home / Self Care | Payer: Medicare Other | Attending: Family Medicine | Admitting: Family Medicine

## 2018-12-13 ENCOUNTER — Encounter (HOSPITAL_COMMUNITY): Payer: Self-pay

## 2018-12-13 DIAGNOSIS — M7918 Myalgia, other site: Secondary | ICD-10-CM

## 2018-12-13 HISTORY — DX: Essential (primary) hypertension: I10

## 2018-12-13 MED ORDER — METHYLPREDNISOLONE 4 MG PO TBPK: ORAL_TABLET | ORAL | 0 refills | Status: DC

## 2018-12-13 MED ORDER — CYCLOBENZAPRINE HCL 5 MG PO TABS: 5.0000 mg | ORAL_TABLET | Freq: Three times a day (TID) | ORAL | 0 refills | Status: DC | PRN

## 2018-12-13 NOTE — Discharge Instructions (Signed)
We will go ahead and treat you today with prednisone and Flexeril as we did before Make sure you take the prednisone with food to avoid upset stomach Be aware of the Flexeril may make you drowsy Follow up as needed for continued or worsening symptoms

## 2018-12-13 NOTE — ED Triage Notes (Signed)
Pt presents with chronic right hip pain from unknown source.

## 2018-12-15 NOTE — ED Provider Notes (Signed)
[facility]    CSN: [id number] Arrival date & time: 12/13/18  1353     History   Chief Complaint Chief Complaint  Patient presents with  . Hip Pain    HPI Destiny Wheeler is a 68 y.o. female.   Pt is a 68 year old female that presents with chronic right  hip pain. She has been seen here for this before. Reports the pain was much worse then and she wanted to come in before it got more severe. This started a few days ago. She has not taken anything for the pain. Denies any hip injuries or falls. No fever, chills, myalgias.   ROS per HPI      Past Medical History:  Diagnosis Date  . Hypertension     Patient Active Problem List   Diagnosis Date Noted  . Acute colitis 02/05/2018  . Rectal bleeding 02/05/2018  . Accelerated hypertension 02/05/2018  . Hypokalemia 02/05/2018  . OSTEOARTHROSIS UNSPEC WHETHER GEN/LOCALIZED HAND 10/19/2007  . 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 No pertinent surgical history.  OB History   No obstetric history on file.      Home Medications    Prior to Admission medications   Medication Sig Start Date End Date Taking? Authorizing Provider  acetaminophen (TYLENOL) 500 MG tablet Take 500 mg by mouth every 4 (four) hours as needed for moderate pain.    [provider]  amLODipine-benazepril (LOTREL) 5-10 MG capsule Take 1 capsule by mouth daily. 01/21/18   [provider]  cyclobenzaprine (FLEXERIL) 5 MG tablet Take 1 tablet (5 mg total) by mouth 3 (three) times daily as needed for muscle spasms. 12/13/18   Dahlia Byes A, NP  methylPREDNISolone (MEDROL DOSEPAK) 4 MG TBPK tablet tad 12/13/18   Alanzo Lamb A, NP  Multiple Vitamins-Minerals (CENTRUM SILVER 50+WOMEN PO) Take 1 tablet by mouth daily.    [provider]  omeprazole (PRILOSEC) 20 MG capsule Take 1 capsule (20 mg total) by mouth daily. 08/12/16 08/22/16  Shaune Pollack, MD    Family History History reviewed. No pertinent family  history.  Social History Social History   Tobacco Use  . Smoking status: Never Smoker  . Smokeless tobacco: Never Used  Substance Use Topics  . Alcohol use: No  . Drug use: No     Allergies   Iodine   Review of Systems Review of Systems   Physical Exam Triage Vital Signs ED Triage Vitals  Enc Vitals Group     BP 12/13/18 1422 (!) 131/116     Pulse Rate 12/13/18 1422 76     Resp 12/13/18 1422 17     Temp 12/13/18 1422 98.8 F (37.1 C)     Temp Source 12/13/18 1422 Oral     SpO2 12/13/18 1422 98 %     Weight --      Height --      Head Circumference --      Peak Flow --      Pain Score 12/13/18 1426 6     Pain Loc --      Pain Edu? --      Excl. in GC? --    No data found.  Updated Vital Signs BP (!) 131/116 (BP Location: Right Arm)   Pulse 76   Temp 98.8 F (37.1 C) (Oral)   Resp 17   SpO2 98%   Visual Acuity Right Eye Distance:   Left Eye Distance:   Bilateral Distance:  Right Eye Near:   Left Eye Near:    Bilateral Near:     Physical Exam Vitals signs and nursing note reviewed.  Constitutional:      General: She is not in acute distress.    Appearance: Normal appearance. She is not ill-appearing or toxic-appearing.  HENT:     Head: Normocephalic and atraumatic.     Nose: Nose normal.  Neck:     Musculoskeletal: Normal range of motion.  Pulmonary:     Effort: Pulmonary effort is normal.  Musculoskeletal: Normal range of motion.     Comments: Normal ROM of the right  hip, without swelling erythema or deformity. No crepitus.  Mostly tender to the right sciatic notch.    Neurological:     Mental Status: She is alert.      UC Treatments / Results  Labs (all labs ordered are listed, but only abnormal results are displayed) Labs Reviewed - No data to display  EKG None  Radiology No results found.  Procedures Procedures (including critical care time)  Medications Ordered in UC Medications - No data to display  Initial  Impression / Assessment and Plan / UC Course  I have reviewed the triage vital signs and the nursing notes.  Pertinent labs & imaging results that were available during my care of the patient were reviewed by me and considered in my medical decision making (see chart for details).     Symptoms consistent with early sciatic nerve pain.  Will treat with steroids and low dose  muscle relaxant as we have before since she reported these helped.  No need for treatment in clinic today.  Follow up as needed for continued or worsening symptoms  Final Clinical Impressions(s) / UC Diagnoses   Final diagnoses:  Pain in right buttock     Discharge Instructions     We will go ahead and treat you today with prednisone and Flexeril as we did before Make sure you take the prednisone with food to avoid upset stomach Be aware of the Flexeril may make you drowsy Follow up as needed for continued or worsening symptoms     ED Prescriptions    Medication Sig Dispense Auth. Provider   methylPREDNISolone (MEDROL DOSEPAK) 4 MG TBPK tablet tad 21 tablet Mariachristina Holle A, NP   cyclobenzaprine (FLEXERIL) 5 MG tablet Take 1 tablet (5 mg total) by mouth 3 (three) times daily as needed for muscle spasms. 30 tablet Janace Aris, NP     Controlled Substance Prescriptions [place] Controlled Substance Registry consulted? no   Janace Aris, NP 12/15/18 [phone]

## 2019-03-16 ENCOUNTER — Encounter (HOSPITAL_COMMUNITY): Payer: Self-pay | Admitting: Emergency Medicine

## 2019-03-16 ENCOUNTER — Emergency Department (HOSPITAL_COMMUNITY): Payer: No Typology Code available for payment source

## 2019-03-16 ENCOUNTER — Other Ambulatory Visit: Payer: Self-pay

## 2019-03-16 ENCOUNTER — Emergency Department (HOSPITAL_COMMUNITY)
Admission: EM | Admit: 2019-03-16 | Discharge: 2019-03-16 | Disposition: A | Discharge status: Home / Self Care | Payer: No Typology Code available for payment source | Attending: Emergency Medicine | Admitting: Emergency Medicine

## 2019-03-16 DIAGNOSIS — M791 Myalgia, unspecified site: Secondary | ICD-10-CM | POA: Insufficient documentation

## 2019-03-16 DIAGNOSIS — R0789 Other chest pain: Secondary | ICD-10-CM | POA: Insufficient documentation

## 2019-03-16 DIAGNOSIS — M79605 Pain in left leg: Secondary | ICD-10-CM | POA: Insufficient documentation

## 2019-03-16 DIAGNOSIS — Y999 Unspecified external cause status: Secondary | ICD-10-CM | POA: Insufficient documentation

## 2019-03-16 DIAGNOSIS — M79652 Pain in left thigh: Secondary | ICD-10-CM | POA: Diagnosis not present

## 2019-03-16 DIAGNOSIS — M79661 Pain in right lower leg: Secondary | ICD-10-CM | POA: Diagnosis not present

## 2019-03-16 DIAGNOSIS — S8991XA Unspecified injury of right lower leg, initial encounter: Secondary | ICD-10-CM | POA: Diagnosis not present

## 2019-03-16 DIAGNOSIS — S161XXA Strain of muscle, fascia and tendon at neck level, initial encounter: Secondary | ICD-10-CM | POA: Insufficient documentation

## 2019-03-16 DIAGNOSIS — S199XXA Unspecified injury of neck, initial encounter: Secondary | ICD-10-CM | POA: Diagnosis present

## 2019-03-16 DIAGNOSIS — M79604 Pain in right leg: Secondary | ICD-10-CM | POA: Insufficient documentation

## 2019-03-16 DIAGNOSIS — I10 Essential (primary) hypertension: Secondary | ICD-10-CM | POA: Insufficient documentation

## 2019-03-16 DIAGNOSIS — S0990XA Unspecified injury of head, initial encounter: Secondary | ICD-10-CM | POA: Diagnosis not present

## 2019-03-16 DIAGNOSIS — M549 Dorsalgia, unspecified: Secondary | ICD-10-CM | POA: Diagnosis not present

## 2019-03-16 DIAGNOSIS — Y92410 Unspecified street and highway as the place of occurrence of the external cause: Secondary | ICD-10-CM | POA: Diagnosis not present

## 2019-03-16 DIAGNOSIS — Z79899 Other long term (current) drug therapy: Secondary | ICD-10-CM | POA: Diagnosis not present

## 2019-03-16 DIAGNOSIS — S3992XA Unspecified injury of lower back, initial encounter: Secondary | ICD-10-CM | POA: Diagnosis not present

## 2019-03-16 DIAGNOSIS — Y939 Activity, unspecified: Secondary | ICD-10-CM | POA: Insufficient documentation

## 2019-03-16 DIAGNOSIS — R1030 Lower abdominal pain, unspecified: Secondary | ICD-10-CM | POA: Diagnosis not present

## 2019-03-16 DIAGNOSIS — M7989 Other specified soft tissue disorders: Secondary | ICD-10-CM | POA: Diagnosis not present

## 2019-03-16 DIAGNOSIS — S299XXA Unspecified injury of thorax, initial encounter: Secondary | ICD-10-CM | POA: Diagnosis not present

## 2019-03-16 DIAGNOSIS — M79651 Pain in right thigh: Secondary | ICD-10-CM | POA: Diagnosis not present

## 2019-03-16 DIAGNOSIS — R079 Chest pain, unspecified: Secondary | ICD-10-CM | POA: Diagnosis not present

## 2019-03-16 DIAGNOSIS — M79662 Pain in left lower leg: Secondary | ICD-10-CM | POA: Diagnosis not present

## 2019-03-16 DIAGNOSIS — S79922A Unspecified injury of left thigh, initial encounter: Secondary | ICD-10-CM | POA: Diagnosis not present

## 2019-03-16 DIAGNOSIS — R51 Headache: Secondary | ICD-10-CM | POA: Insufficient documentation

## 2019-03-16 DIAGNOSIS — S79921A Unspecified injury of right thigh, initial encounter: Secondary | ICD-10-CM | POA: Diagnosis not present

## 2019-03-16 DIAGNOSIS — R000 Tachycardia, unspecified: Secondary | ICD-10-CM | POA: Diagnosis not present

## 2019-03-16 DIAGNOSIS — M545 Low back pain: Secondary | ICD-10-CM | POA: Diagnosis not present

## 2019-03-16 DIAGNOSIS — S8992XA Unspecified injury of left lower leg, initial encounter: Secondary | ICD-10-CM | POA: Diagnosis not present

## 2019-03-16 DIAGNOSIS — M546 Pain in thoracic spine: Secondary | ICD-10-CM | POA: Diagnosis not present

## 2019-03-16 MED ORDER — ACETAMINOPHEN 325 MG PO TABS: 650.0000 mg | ORAL_TABLET | Freq: Four times a day (QID) | ORAL | 0 refills | Status: DC | PRN

## 2019-03-16 MED ORDER — IBUPROFEN 400 MG PO TABS: 400.0000 mg | ORAL_TABLET | Freq: Three times a day (TID) | ORAL | 0 refills | Status: DC | PRN

## 2019-03-16 MED ORDER — HYDROCODONE-ACETAMINOPHEN 5-325 MG PO TABS
2.0000 | ORAL_TABLET | Freq: Once | ORAL | Status: AC
  Administered 2019-03-16: 2 via ORAL
  Filled 2019-03-16: qty 2

## 2019-03-16 NOTE — ED Notes (Signed)
Pt transported to CT and Xray. 

## 2019-03-16 NOTE — Discharge Instructions (Signed)
If you develop new or severe chest or abdominal pain, pain in your hips, severe headache, weakness or numbness in your arms or legs, or any other new/concerning symptoms, then return to the ER for evaluation. Otherwise, take tylenol and/or ibuprofen for pain. Follow up with your doctor.
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
## 2019-03-16 NOTE — ED Notes (Signed)
Pt able to ambulate without assistance with a steady gait. Pt in NAD

## 2019-03-16 NOTE — ED Notes (Signed)
Pt resting in bed. Pharmacy at bedside. Meds given per Mar and VS rechecked and stable. Pt in NAD
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
## 2019-03-28 ENCOUNTER — Other Ambulatory Visit: Payer: Self-pay

## 2019-03-28 ENCOUNTER — Encounter (HOSPITAL_COMMUNITY): Payer: Self-pay | Admitting: Emergency Medicine

## 2019-03-28 ENCOUNTER — Ambulatory Visit (HOSPITAL_COMMUNITY)
Admission: EM | Admit: 2019-03-28 | Discharge: 2019-03-28 | Disposition: A | Discharge status: Home / Self Care | Payer: Medicare Other | Attending: Emergency Medicine | Admitting: Emergency Medicine

## 2019-03-28 DIAGNOSIS — R58 Hemorrhage, not elsewhere classified: Secondary | ICD-10-CM

## 2019-03-28 DIAGNOSIS — R51 Headache: Secondary | ICD-10-CM

## 2019-03-28 DIAGNOSIS — M7918 Myalgia, other site: Secondary | ICD-10-CM

## 2019-03-28 MED ORDER — DEXAMETHASONE SODIUM PHOSPHATE 10 MG/ML IJ SOLN
10.0000 mg | Freq: Once | INTRAMUSCULAR | Status: AC
  Administered 2019-03-28: 10 mg via INTRAMUSCULAR

## 2019-03-28 MED ORDER — KETOROLAC TROMETHAMINE 30 MG/ML IJ SOLN
INTRAMUSCULAR | Status: AC
  Filled 2019-03-28: qty 1

## 2019-03-28 MED ORDER — CYCLOBENZAPRINE HCL 5 MG PO TABS: 5.0000 mg | ORAL_TABLET | Freq: Two times a day (BID) | ORAL | 0 refills | Status: DC | PRN

## 2019-03-28 MED ORDER — KETOROLAC TROMETHAMINE 60 MG/2ML IM SOLN
30.0000 mg | Freq: Once | INTRAMUSCULAR | Status: AC
  Administered 2019-03-28: 30 mg via INTRAMUSCULAR

## 2019-03-28 MED ORDER — DEXAMETHASONE SODIUM PHOSPHATE 10 MG/ML IJ SOLN
INTRAMUSCULAR | Status: AC
  Filled 2019-03-28: qty 1

## 2019-03-28 MED ORDER — MELOXICAM 7.5 MG PO TABS: 7.5000 mg | ORAL_TABLET | Freq: Every day | ORAL | 0 refills | Status: DC
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
## 2019-03-28 NOTE — Discharge Instructions (Signed)
We gave you a shot of Toradol and decadron today Please apply warm compresses to area of bruising to further help with swelling, monitor for this area on your right upper thigh to continue to gradually resolve, if this is increasing in size, becoming red and hot please follow-up in person.  You may continue to use Tylenol/ibuprofen combination or you may try Mobic/meloxicam as alternative which I have sent to your pharmacy.  You may use flexeril as needed to help with pain. This is a muscle relaxer and causes sedation- please use only at bedtime or when you will be home and not have to drive/work  Continued to move around and have light activity in order to work tension out of muscles  Please follow-up if your headache or any of your pains are worsening or not resolving despite the above over the next 1 to 2 weeks.

## 2019-03-31 DIAGNOSIS — M25562 Pain in left knee: Secondary | ICD-10-CM | POA: Diagnosis not present

## 2019-03-31 DIAGNOSIS — M25561 Pain in right knee: Secondary | ICD-10-CM | POA: Diagnosis not present

## 2019-04-03 ENCOUNTER — Ambulatory Visit (INDEPENDENT_AMBULATORY_CARE_PROVIDER_SITE_OTHER): Payer: Medicare Other | Admitting: Family Medicine

## 2019-04-03 ENCOUNTER — Other Ambulatory Visit: Payer: Self-pay

## 2019-04-03 DIAGNOSIS — I10 Essential (primary) hypertension: Secondary | ICD-10-CM

## 2019-04-03 DIAGNOSIS — Z23 Encounter for immunization: Secondary | ICD-10-CM

## 2019-04-03 DIAGNOSIS — Z8719 Personal history of other diseases of the digestive system: Secondary | ICD-10-CM
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
## 2019-04-03 NOTE — Progress Notes (Signed)
Called patient to initiate their telephone visit with provider Molli Barrows, FNP-C. Verified date of birth. States the back/arm/leg pain is about the same. Is still having headaches. KWalker, CMA.

## 2019-04-04 ENCOUNTER — Ambulatory Visit: Payer: Medicare Other | Admitting: Family Medicine

## 2019-04-04 VITALS — BP 143/76 | HR 64 | Temp 97.9°F | Resp 17 | Wt 245.0 lb

## 2019-04-04 DIAGNOSIS — I10 Essential (primary) hypertension: Secondary | ICD-10-CM | POA: Diagnosis not present

## 2019-04-04 DIAGNOSIS — Z1329 Encounter for screening for other suspected endocrine disorder: Secondary | ICD-10-CM

## 2019-04-04 DIAGNOSIS — Z13220 Encounter for screening for lipoid disorders: Secondary | ICD-10-CM

## 2019-04-04 DIAGNOSIS — S8012XD Contusion of left lower leg, subsequent encounter: Secondary | ICD-10-CM | POA: Diagnosis not present

## 2019-04-04 DIAGNOSIS — S134XXD Sprain of ligaments of cervical spine, subsequent encounter: Secondary | ICD-10-CM | POA: Diagnosis not present

## 2019-04-04 DIAGNOSIS — S8011XD Contusion of right lower leg, subsequent encounter: Secondary | ICD-10-CM | POA: Diagnosis not present

## 2019-04-04 MED ORDER — AMLODIPINE BESY-BENAZEPRIL HCL 10-20 MG PO CAPS: 1.0000 | ORAL_CAPSULE | Freq: Every day | ORAL | 2 refills | Status: DC

## 2019-04-04 NOTE — Progress Notes (Signed)
Patient ID: Destiny Wheeler, female    DOB: 05-21-1951, 68 y.o.   MRN: [id number]  PCP: Bing NeighborsHarris, Symphony Demuro S, FNP  No chief complaint on file.   Subjective:  HPI Destiny Wheeler is a 68 y.o. female presents for evaluation hypertension evaluation with BP check.  Destiny Wheeler OSTEOARTHROSIS UNSPEC WHETHER GEN/LOCALIZED HAND; DEQUERVAIN'S; Acute colitis; Rectal bleeding; Accelerated hypertension; and Hypokalemia on their problem list.   Hypertension follow-up Destiny Wheeler blood pressure is elevated on arrival today, 143/76. Reports taking medication this morning. Currently prescribed amlodipine-benzapril 5-10 mg. Denies dizziness, shortness of breath, weakness, or chest pain.  Social History   Socioeconomic History  . Marital status: Divorced    Spouse name: Not on file  . Number of children: Not on file  . Years of education: Not on file  . Highest education level: Not on file  Occupational History  . Not on file  Social Needs  . Financial resource strain: Not on file  . Food insecurity    Worry: Not on file    Inability: Not on file  . Transportation needs    Medical: Not on file    Non-medical: Not on file  Tobacco Use  . Smoking status: Never Smoker  . Smokeless tobacco: Never Used  Substance and Sexual Activity  . Alcohol use: No  . Drug use: No  . Sexual activity: Never  Lifestyle  . Physical activity    Days per week: Not on file    Minutes per session: Not on file  . Stress: Not on file  Relationships  . Social Musicianconnections    Talks on phone: Not on file    Gets together: Not on file    Attends religious service: Not on file    Active member of club or organization: Not on file    Attends meetings of clubs or organizations: Not on file    Relationship status: Not on file  . Intimate partner violence    Fear of current or ex partner: Not on file    Emotionally abused: Not on file    Physically abused: Not on file    Forced sexual activity:  Not on file  Other Topics Concern  . Not on file  Social History Narrative  . Not on file    No family history on file.   Review of Systems Pertinent negatives listed in HPI Allergies  Allergen Reactions  . Iodine     Reports face swelling from IV contrast    Prior to Admission medications   Medication Sig Start Date End Date Taking? Authorizing Provider  acetaminophen (TYLENOL) 325 MG tablet Take 2 tablets (650 mg total) by mouth every 6 (six) hours as needed. 03/16/19   Pricilla LovelessGoldston, Scott, MD  amLODipine-benazepril (LOTREL) 10-20 MG capsule Take 1 capsule by mouth daily. 04/04/19   Bing NeighborsHarris, Natacia Chaisson S, FNP  cyclobenzaprine (FLEXERIL) 5 MG tablet Take 1 tablet (5 mg total) by mouth 2 (two) times daily as needed for muscle spasms. 03/28/19   Wieters, Hallie C, PA-C  Multiple Vitamins-Minerals (CENTRUM SILVER 50+WOMEN PO) Take 1 tablet by mouth daily.    [provider]  omeprazole (PRILOSEC) 20 MG capsule Take 1 capsule (20 mg total) by mouth daily. Patient not taking: Reported on 03/16/2019 08/12/16 03/28/19  Shaune PollackIsaacs, Cameron, MD    Past Medical, Surgical Family and Social History reviewed and updated.    Objective:   Today's Vitals   04/04/19 1126  BP: (!) 143/76  Pulse:  64  Resp: 17  Temp: 97.9 F (36.6 C)  TempSrc: Temporal  SpO2: 98%  Weight: 245 lb (111.1 kg)    BP Readings from Last 3 Encounters:  03/28/19 (!) 140/105  03/16/19 (!) 145/98  12/13/18 (!) 131/116    Filed Weights   04/04/19 1126  Weight: 245 lb (111.1 kg)       Physical Exam Deferred. Initially nurse visit. Provider visit now due to medication adjustments only as documented in plan.   Assessment & Plan:  1. Essential hypertension Increased  amLODipine-benazepril (LOTREL) 10-20 MG capsule; Take 1 capsule by mouth daily.   Return for follow-up in 1 month. Check the following today: - Comprehensive metabolic panel - CBC with Differential  2. Screening, lipid - Lipid panel  3.  Screening for thyroid disorder - Thyroid Panel With TSH   Current Outpatient Medications on File Prior to Visit  Medication Sig Dispense Refill  . acetaminophen (TYLENOL) 325 MG tablet Take 2 tablets (650 mg total) by mouth every 6 (six) hours as needed. 60 tablet 0  . cyclobenzaprine (FLEXERIL) 5 MG tablet Take 1 tablet (5 mg total) by mouth 2 (two) times daily as needed for muscle spasms. 24 tablet 0  . Multiple Vitamins-Minerals (CENTRUM SILVER 50+WOMEN PO) Take 1 tablet by mouth daily.    . [DISCONTINUED] omeprazole (PRILOSEC) 20 MG capsule Take 1 capsule (20 mg total) by mouth daily. (Patient not taking: Reported on 03/16/2019) 10 capsule 0   No current facility-administered medications on file prior to visit.      Molli Barrows, FNP Primary Care at [facility] [address] [place] [phone]: [phone]

## 2019-04-04 NOTE — Progress Notes (Deleted)
Patient here for fasting labs & vitals. spoke with provider & she made adjustments to patient's BP medication. Would like to see her back in 4 weeks. Patient declined Tdap vaccine. KWalker, CMA.

## 2019-04-04 NOTE — Patient Instructions (Addendum)
[facility]    [facility].   Destiny Wheeler's primary care doctor is Bing NeighborsHarris, Destiny Wheeler S, FNP.   For the best care possible,  you should try to see Joaquin CourtsKimberly Emre Stock, FNP-C  whenever you come to clinic.   We look forward to seeing you again soon!  If you have any questions about your visit today,  please call us at [phone]  Or feel free to reach your provider via MyChart.     I have increased your blood pressure medication as follows: Amlodipine-Benzapril 10-20 mg.  New prescription has been sent to your pharmacy. You will be notified via phone regarding your lab results.  Return to office in 4 weeks for blood pressure follow-up.  Please take medication prior to appointment.   Hypertension Hypertension is another name for high blood pressure. High blood pressure forces your heart to work harder to pump blood. This can cause problems over time. There are two numbers in a blood pressure reading. There is a top number (systolic) over a bottom number (diastolic). It is best to have a blood pressure below 120/80. Healthy choices can help lower your blood pressure. You may need medicine to help lower your blood pressure if:  Your blood pressure cannot be lowered with healthy choices.  Your blood pressure is higher than 130/80. Follow these instructions at home: Eating and drinking   If directed, follow the DASH eating plan. This diet includes: ? Filling half of your plate at each meal with fruits and vegetables. ? Filling one quarter of your plate at each meal with whole grains. Whole grains include whole wheat pasta, brown rice, and whole grain bread. ? Eating or drinking low-fat dairy products, such as skim milk or low-fat yogurt. ? Filling one quarter of your plate at each meal with low-fat (lean) proteins. Low-fat proteins include fish, skinless chicken, eggs, beans, and  tofu. ? Avoiding fatty meat, cured and processed meat, or chicken with skin. ? Avoiding premade or processed food.  Eat less than 1,500 mg of salt (sodium) a day.  Limit alcohol use to no more than 1 drink a day for nonpregnant women and 2 drinks a day for men. One drink equals 12 oz of beer, 5 oz of wine, or 1 oz of hard liquor. Lifestyle  Work with your doctor to stay at a healthy weight or to lose weight. Ask your doctor what the best weight is for you.  Get at least 30 minutes of exercise that causes your heart to beat faster (aerobic exercise) most days of the week. This may include walking, swimming, or biking.  Get at least 30 minutes of exercise that strengthens your muscles (resistance exercise) at least 3 days a week. This may include lifting weights or pilates.  Do not use any products that contain nicotine or tobacco. This includes cigarettes and e-cigarettes. If you need help quitting, ask your doctor.  Check your blood pressure at home as told by your doctor.  Keep all follow-up visits as told by your doctor. This is important. Medicines  Take over-the-counter and prescription medicines only as told by your doctor. Follow directions carefully.  Do not skip doses of blood pressure medicine. The medicine does not work as well if you skip doses. Skipping doses also puts you at risk for problems.  Ask your doctor about side effects or reactions to medicines that you should watch  for. Contact a doctor if:  You think you are having a reaction to the medicine you are taking.  You have headaches that keep coming back (recurring).  You feel dizzy.  You have swelling in your ankles.  You have trouble with your vision. Get help right away if:  You get a very bad headache.  You start to feel confused.  You feel weak or numb.  You feel faint.  You get very bad pain in your: ? Chest. ? Belly (abdomen).  You throw up (vomit) more than once.  You have trouble  breathing. Summary  Hypertension is another name for high blood pressure.  Making healthy choices can help lower blood pressure. If your blood pressure cannot be controlled with healthy choices, you may need to take medicine. This information is not intended to replace advice given to you by your health care provider. Make sure you discuss any questions you have with your health care provider. Document Released: 03/16/2008 Document Revised: 08/26/2016 Document Reviewed: 08/26/2016 Elsevier Interactive Patient Education  2019 Elsevier Inc.  DASH Eating Plan DASH stands for "Dietary Approaches to Stop Hypertension." The DASH eating plan is a healthy eating plan that has been shown to reduce high blood pressure (hypertension). It may also reduce your risk for type 2 diabetes, heart disease, and stroke. The DASH eating plan may also help with weight loss. What are tips for following this plan?  General guidelines  Avoid eating more than 2,300 mg (milligrams) of salt (sodium) a day. If you have hypertension, you may need to reduce your sodium intake to 1,500 mg a day.  Limit alcohol intake to no more than 1 drink a day for nonpregnant women and 2 drinks a day for men. One drink equals 12 oz of beer, 5 oz of wine, or 1 oz of hard liquor.  Work with your health care provider to maintain a healthy body weight or to lose weight. Ask what an ideal weight is for you.  Get at least 30 minutes of exercise that causes your heart to beat faster (aerobic exercise) most days of the week. Activities may include walking, swimming, or biking.  Work with your health care provider or diet and nutrition specialist (dietitian) to adjust your eating plan to your individual calorie needs. Reading food labels   Check food labels for the amount of sodium per serving. Choose foods with less than 5 percent of the Daily Value of sodium. Generally, foods with less than 300 mg of sodium per serving fit into this eating  plan.  To find whole grains, look for the word "whole" as the first word in the ingredient list. Shopping  Buy products labeled as "low-sodium" or "no salt added."  Buy fresh foods. Avoid canned foods and premade or frozen meals. Cooking  Avoid adding salt when cooking. Use salt-free seasonings or herbs instead of table salt or sea salt. Check with your health care provider or pharmacist before using salt substitutes.  Do not fry foods. Cook foods using healthy methods such as baking, boiling, grilling, and broiling instead.  Cook with heart-healthy oils, such as olive, canola, soybean, or sunflower oil. Meal planning  Eat a balanced diet that includes: ? 5 or more servings of fruits and vegetables each day. At each meal, try to fill half of your plate with fruits and vegetables. ? Up to 6-8 servings of whole grains each day. ? Less than 6 oz of lean meat, poultry, or fish each day. A 3-oz  serving of meat is about the same size as a deck of cards. One egg equals 1 oz. ? 2 servings of low-fat dairy each day. ? A serving of nuts, seeds, or beans 5 times each week. ? Heart-healthy fats. Healthy fats called Omega-3 fatty acids are found in foods such as flaxseeds and coldwater fish, like sardines, salmon, and mackerel.  Limit how much you eat of the following: ? Canned or prepackaged foods. ? Food that is high in trans fat, such as fried foods. ? Food that is high in saturated fat, such as fatty meat. ? Sweets, desserts, sugary drinks, and other foods with added sugar. ? Full-fat dairy products.  Do not salt foods before eating.  Try to eat at least 2 vegetarian meals each week.  Eat more home-cooked food and less restaurant, buffet, and fast food.  When eating at a restaurant, ask that your food be prepared with less salt or no salt, if possible. What foods are recommended? The items listed may not be a complete list. Talk with your dietitian about what dietary choices are best  for you. Grains Whole-grain or whole-wheat bread. Whole-grain or whole-wheat pasta. Brown rice. [place]. Bulgur. Whole-grain and low-sodium cereals. Pita bread. Low-fat, low-sodium crackers. Whole-wheat flour tortillas. Vegetables Fresh or frozen vegetables (raw, steamed, roasted, or grilled). Low-sodium or reduced-sodium tomato and vegetable juice. Low-sodium or reduced-sodium tomato sauce and tomato paste. Low-sodium or reduced-sodium canned vegetables. Fruits All fresh, dried, or frozen fruit. Canned fruit in natural juice (without added sugar). Meat and other protein foods Skinless chicken or [place]. Ground chicken or [place]. Pork with fat trimmed off. Fish and seafood. Egg whites. Dried beans, peas, or lentils. Unsalted nuts, nut butters, and seeds. Unsalted canned beans. Lean cuts of beef with fat trimmed off. Low-sodium, lean deli meat. Dairy Low-fat (1%) or fat-free (skim) milk. Fat-free, low-fat, or reduced-fat cheeses. Nonfat, low-sodium ricotta or cottage cheese. Low-fat or nonfat yogurt. Low-fat, low-sodium cheese. Fats and oils Soft margarine without trans fats. Vegetable oil. Low-fat, reduced-fat, or light mayonnaise and salad dressings (reduced-sodium). Canola, safflower, olive, soybean, and sunflower oils. Avocado. Seasoning and other foods Herbs. Spices. Seasoning mixes without salt. Unsalted popcorn and pretzels. Fat-free sweets. What foods are not recommended? The items listed may not be a complete list. Talk with your dietitian about what dietary choices are best for you. Grains Baked goods made with fat, such as croissants, muffins, or some breads. Dry pasta or rice meal packs. Vegetables Creamed or fried vegetables. Vegetables in a cheese sauce. Regular canned vegetables (not low-sodium or reduced-sodium). Regular canned tomato sauce and paste (not low-sodium or reduced-sodium). Regular tomato and vegetable juice (not low-sodium or reduced-sodium). Destiny Wheeler.  Olives. Fruits Canned fruit in a light or heavy syrup. Fried fruit. Fruit in cream or butter sauce. Meat and other protein foods Fatty cuts of meat. Ribs. Fried meat. Destiny Wheeler. Sausage. Bologna and other processed lunch meats. Salami. Fatback. Hotdogs. Bratwurst. Salted nuts and seeds. Canned beans with added salt. Canned or smoked fish. Whole eggs or egg yolks. Chicken or [place] with skin. Dairy Whole or 2% milk, cream, and half-and-half. Whole or full-fat cream cheese. Whole-fat or sweetened yogurt. Full-fat cheese. Nondairy creamers. Whipped toppings. Processed cheese and cheese spreads. Fats and oils Butter. Stick margarine. Lard. Shortening. Ghee. Bacon fat. Tropical oils, such as coconut, palm kernel, or palm oil. Seasoning and other foods Salted popcorn and pretzels. Onion salt, garlic salt, seasoned salt, table salt, and sea salt. Worcestershire sauce. Tartar sauce. Barbecue  sauce. Teriyaki sauce. Soy sauce, including reduced-sodium. Steak sauce. Canned and packaged gravies. Fish sauce. Oyster sauce. Cocktail sauce. Horseradish that you find on the shelf. Ketchup. Mustard. Meat flavorings and tenderizers. Bouillon cubes. Hot sauce and Tabasco sauce. Premade or packaged marinades. Premade or packaged taco seasonings. Relishes. Regular salad dressings. Where to find more information:  National Heart, Lung, and Blood Institute: PopSteam.iswww.nhlbi.nih.gov  American Heart Association: www.heart.org Summary  The DASH eating plan is a healthy eating plan that has been shown to reduce high blood pressure (hypertension). It may also reduce your risk for type 2 diabetes, heart disease, and stroke.  With the DASH eating plan, you should limit salt (sodium) intake to 2,300 mg a day. If you have hypertension, you may need to reduce your sodium intake to 1,500 mg a day.  When on the DASH eating plan, aim to eat more fresh fruits and vegetables, whole grains, lean proteins, low-fat dairy, and heart-healthy  fats.  Work with your health care provider or diet and nutrition specialist (dietitian) to adjust your eating plan to your individual calorie needs. This information is not intended to replace advice given to you by your health care provider. Make sure you discuss any questions you have with your health care provider. Document Released: 09/17/2011 Document Revised: 09/21/2016 Document Reviewed: 09/21/2016 Elsevier Interactive Patient Education  2019 [employer]

## 2019-04-05 ENCOUNTER — Telehealth: Payer: Self-pay

## 2019-04-05 LAB — THYROID PANEL WITH TSH
Free Thyroxine Index: 2.1 (ref 1.2–4.9)
T3 Uptake Ratio: 28 % (ref 24–39)
T4, Total: 7.6 ug/dL (ref 4.5–12.0)
TSH: 1.95 u[IU]/mL (ref 0.450–4.500)

## 2019-04-05 LAB — CBC WITH DIFFERENTIAL/PLATELET
Basophils Absolute: 0.1 10*3/uL (ref 0.0–0.2)
Basos: 1 %
EOS (ABSOLUTE): 0.2 10*3/uL (ref 0.0–0.4)
Eos: 1 %
Hematocrit: 39 % (ref 34.0–46.6)
Hemoglobin: 12.7 g/dL (ref 11.1–15.9)
Immature Grans (Abs): 0 10*3/uL (ref 0.0–0.1)
Immature Granulocytes: 0 %
Lymphocytes Absolute: 4.1 10*3/uL — ABNORMAL HIGH (ref 0.7–3.1)
Lymphs: 26 %
MCH: 29.7 pg (ref 26.6–33.0)
MCHC: 32.6 g/dL (ref 31.5–35.7)
MCV: 91 fL (ref 79–97)
Monocytes Absolute: 0.9 10*3/uL (ref 0.1–0.9)
Monocytes: 6 %
Neutrophils Absolute: 10.1 10*3/uL — ABNORMAL HIGH (ref 1.4–7.0)
Neutrophils: 66 %
Platelets: 326 10*3/uL (ref 150–450)
RBC: 4.28 x10E6/uL (ref 3.77–5.28)
RDW: 13.1 % (ref 11.7–15.4)
WBC: 15.4 10*3/uL — ABNORMAL HIGH (ref 3.4–10.8)

## 2019-04-05 LAB — COMPREHENSIVE METABOLIC PANEL
ALT: 16 IU/L (ref 0–32)
AST: 17 IU/L (ref 0–40)
Albumin/Globulin Ratio: 1.3 (ref 1.2–2.2)
Albumin: 4 g/dL (ref 3.8–4.8)
Alkaline Phosphatase: 108 IU/L (ref 39–117)
BUN/Creatinine Ratio: 19 (ref 12–28)
BUN: 13 mg/dL (ref 8–27)
Bilirubin Total: 0.3 mg/dL (ref 0.0–1.2)
CO2: 23 mmol/L (ref 20–29)
Calcium: 9.5 mg/dL (ref 8.7–10.3)
Chloride: 102 mmol/L (ref 96–106)
Creatinine, Ser: 0.7 mg/dL (ref 0.57–1.00)
GFR calc Af Amer: 102 mL/min/{1.73_m2} (ref >59)
GFR calc non Af Amer: 89 mL/min/{1.73_m2} (ref >59)
Globulin, Total: 3 g/dL (ref 1.5–4.5)
Glucose: 110 mg/dL — ABNORMAL HIGH (ref 65–99)
Potassium: 4.8 mmol/L (ref 3.5–5.2)
Sodium: 140 mmol/L (ref 134–144)
Total Protein: 7 g/dL (ref 6.0–8.5)

## 2019-04-05 LAB — LIPID PANEL
Chol/HDL Ratio: 2.8 ratio (ref 0.0–4.4)
Cholesterol, Total: 133 mg/dL (ref 100–199)
HDL: 48 mg/dL (ref >39)
LDL Calculated: 66 mg/dL (ref 0–99)
Triglycerides: 97 mg/dL (ref 0–149)
VLDL Cholesterol Cal: 19 mg/dL (ref 5–40)

## 2019-04-05 NOTE — Telephone Encounter (Signed)
Called [facility]([phone], option 2, option 2) to get A1C added on to specimen from yesterday. Spoke with Thayer Headings who states that she will notify lab of add-on.

## 2019-04-06 ENCOUNTER — Telehealth: Payer: Self-pay | Admitting: Family Medicine

## 2019-04-06 DIAGNOSIS — D72829 Elevated white blood cell count, unspecified: Secondary | ICD-10-CM

## 2019-04-06 NOTE — Telephone Encounter (Signed)
Recent labs indicated an elevated white count which can be a sign of infection or inflammation. Inquire if patient feels poorly or has fever. I would like for her to return Tuesday for a repeat CBC and UA.

## 2019-04-07 LAB — HEMOGLOBIN A1C: Hemoglobin A1C: 6.4
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
## 2019-04-07 NOTE — Telephone Encounter (Signed)
I'm not sure why results haven't crossed into Epic yet but I checked patient[facility] & the A1C was 6.4.

## 2019-04-12 DIAGNOSIS — S8011XD Contusion of right lower leg, subsequent encounter: Secondary | ICD-10-CM | POA: Diagnosis not present

## 2019-04-12 DIAGNOSIS — S8012XD Contusion of left lower leg, subsequent encounter: Secondary | ICD-10-CM | POA: Diagnosis not present

## 2019-04-12 DIAGNOSIS — S134XXD Sprain of ligaments of cervical spine, subsequent encounter: Secondary | ICD-10-CM | POA: Diagnosis not present

## 2019-04-12 NOTE — Telephone Encounter (Signed)
Please notify patient that her recent A1C 6.4 indicating prediabetes. It is  recommendation that she starting once daily etforming low he has prediabetes and I recommend starting metformin 500 mg once daily.

## 2019-04-13 DIAGNOSIS — S8011XD Contusion of right lower leg, subsequent encounter: Secondary | ICD-10-CM | POA: Diagnosis not present

## 2019-04-13 DIAGNOSIS — S134XXD Sprain of ligaments of cervical spine, subsequent encounter: Secondary | ICD-10-CM | POA: Diagnosis not present

## 2019-04-13 DIAGNOSIS — S8012XD Contusion of left lower leg, subsequent encounter: Secondary | ICD-10-CM | POA: Diagnosis not present

## 2019-04-13 NOTE — Telephone Encounter (Signed)
Left voice mail to call back 

## 2019-04-17 DIAGNOSIS — S8012XD Contusion of left lower leg, subsequent encounter: Secondary | ICD-10-CM | POA: Diagnosis not present

## 2019-04-17 DIAGNOSIS — S8011XD Contusion of right lower leg, subsequent encounter: Secondary | ICD-10-CM | POA: Diagnosis not present

## 2019-04-17 DIAGNOSIS — S134XXD Sprain of ligaments of cervical spine, subsequent encounter: Secondary | ICD-10-CM | POA: Diagnosis not present

## 2019-04-19 NOTE — Telephone Encounter (Signed)
Left voice mail to call back 

## 2019-04-20 DIAGNOSIS — S8011XD Contusion of right lower leg, subsequent encounter: Secondary | ICD-10-CM | POA: Diagnosis not present

## 2019-04-20 DIAGNOSIS — S8012XD Contusion of left lower leg, subsequent encounter: Secondary | ICD-10-CM | POA: Diagnosis not present

## 2019-04-20 DIAGNOSIS — S134XXD Sprain of ligaments of cervical spine, subsequent encounter: Secondary | ICD-10-CM | POA: Diagnosis not present

## 2019-04-21 DIAGNOSIS — S8011XD Contusion of right lower leg, subsequent encounter: Secondary | ICD-10-CM | POA: Diagnosis not present

## 2019-04-21 DIAGNOSIS — S134XXD Sprain of ligaments of cervical spine, subsequent encounter: Secondary | ICD-10-CM | POA: Diagnosis not present

## 2019-04-21 DIAGNOSIS — S8012XD Contusion of left lower leg, subsequent encounter: Secondary | ICD-10-CM | POA: Diagnosis not present

## 2019-04-27 LAB — SPECIMEN STATUS REPORT

## 2019-04-27 LAB — HEMOGLOBIN A1C
Est. average glucose Bld gHb Est-mCnc: 137 mg/dL
Hgb A1c MFr Bld: 6.4 % — ABNORMAL HIGH (ref 4.8–5.6)

## 2019-05-02 ENCOUNTER — Ambulatory Visit: Payer: Medicare Other | Admitting: Family Medicine

## 2019-05-02 ENCOUNTER — Ambulatory Visit: Payer: Medicare Other | Admitting: Critical Care Medicine

## 2019-05-02 DIAGNOSIS — S8012XD Contusion of left lower leg, subsequent encounter: Secondary | ICD-10-CM | POA: Diagnosis not present

## 2019-05-02 DIAGNOSIS — S8011XD Contusion of right lower leg, subsequent encounter: Secondary | ICD-10-CM | POA: Diagnosis not present

## 2019-05-02 DIAGNOSIS — S134XXD Sprain of ligaments of cervical spine, subsequent encounter: Secondary | ICD-10-CM | POA: Diagnosis not present

## 2019-05-05 DIAGNOSIS — S134XXD Sprain of ligaments of cervical spine, subsequent encounter: Secondary | ICD-10-CM | POA: Diagnosis not present

## 2019-05-05 DIAGNOSIS — S8012XD Contusion of left lower leg, subsequent encounter: Secondary | ICD-10-CM | POA: Diagnosis not present

## 2019-05-05 DIAGNOSIS — S8011XD Contusion of right lower leg, subsequent encounter: Secondary | ICD-10-CM | POA: Diagnosis not present

## 2019-05-09 DIAGNOSIS — S134XXD Sprain of ligaments of cervical spine, subsequent encounter: Secondary | ICD-10-CM | POA: Diagnosis not present

## 2019-05-09 DIAGNOSIS — S8012XD Contusion of left lower leg, subsequent encounter: Secondary | ICD-10-CM | POA: Diagnosis not present

## 2019-05-09 DIAGNOSIS — S8011XD Contusion of right lower leg, subsequent encounter: Secondary | ICD-10-CM | POA: Diagnosis not present

## 2019-05-11 DIAGNOSIS — S8011XD Contusion of right lower leg, subsequent encounter: Secondary | ICD-10-CM | POA: Diagnosis not present

## 2019-05-11 DIAGNOSIS — S8012XD Contusion of left lower leg, subsequent encounter: Secondary | ICD-10-CM | POA: Diagnosis not present

## 2019-05-11 DIAGNOSIS — S134XXD Sprain of ligaments of cervical spine, subsequent encounter: Secondary | ICD-10-CM | POA: Diagnosis not present

## 2019-05-12 DIAGNOSIS — S134XXD Sprain of ligaments of cervical spine, subsequent encounter: Secondary | ICD-10-CM | POA: Diagnosis not present

## 2019-06-06 DIAGNOSIS — S8012XD Contusion of left lower leg, subsequent encounter: Secondary | ICD-10-CM | POA: Diagnosis not present

## 2019-06-06 DIAGNOSIS — S8011XD Contusion of right lower leg, subsequent encounter: Secondary | ICD-10-CM | POA: Diagnosis not present

## 2019-06-06 DIAGNOSIS — S134XXD Sprain of ligaments of cervical spine, subsequent encounter: Secondary | ICD-10-CM | POA: Diagnosis not present

## 2019-06-13 DIAGNOSIS — S134XXD Sprain of ligaments of cervical spine, subsequent encounter: Secondary | ICD-10-CM | POA: Diagnosis not present

## 2019-06-13 DIAGNOSIS — S8012XD Contusion of left lower leg, subsequent encounter: Secondary | ICD-10-CM | POA: Diagnosis not present

## 2019-06-13 DIAGNOSIS — S8011XD Contusion of right lower leg, subsequent encounter: Secondary | ICD-10-CM | POA: Diagnosis not present

## 2019-06-20 DIAGNOSIS — S8012XD Contusion of left lower leg, subsequent encounter: Secondary | ICD-10-CM | POA: Diagnosis not present

## 2019-06-20 DIAGNOSIS — S134XXD Sprain of ligaments of cervical spine, subsequent encounter: Secondary | ICD-10-CM | POA: Diagnosis not present

## 2019-06-20 DIAGNOSIS — S8011XD Contusion of right lower leg, subsequent encounter: Secondary | ICD-10-CM | POA: Diagnosis not present

## 2019-06-22 DIAGNOSIS — S8012XD Contusion of left lower leg, subsequent encounter: Secondary | ICD-10-CM | POA: Diagnosis not present

## 2019-06-22 DIAGNOSIS — S8011XD Contusion of right lower leg, subsequent encounter: Secondary | ICD-10-CM | POA: Diagnosis not present

## 2019-06-22 DIAGNOSIS — S134XXD Sprain of ligaments of cervical spine, subsequent encounter: Secondary | ICD-10-CM | POA: Diagnosis not present

## 2019-06-27 DIAGNOSIS — S8011XD Contusion of right lower leg, subsequent encounter: Secondary | ICD-10-CM | POA: Diagnosis not present

## 2019-06-27 DIAGNOSIS — S134XXD Sprain of ligaments of cervical spine, subsequent encounter: Secondary | ICD-10-CM | POA: Diagnosis not present

## 2019-06-27 DIAGNOSIS — S8012XD Contusion of left lower leg, subsequent encounter: Secondary | ICD-10-CM | POA: Diagnosis not present

## 2019-06-29 DIAGNOSIS — S8011XD Contusion of right lower leg, subsequent encounter: Secondary | ICD-10-CM | POA: Diagnosis not present

## 2019-06-29 DIAGNOSIS — S134XXD Sprain of ligaments of cervical spine, subsequent encounter: Secondary | ICD-10-CM | POA: Diagnosis not present

## 2019-06-29 DIAGNOSIS — S8012XD Contusion of left lower leg, subsequent encounter: Secondary | ICD-10-CM | POA: Diagnosis not present

## 2019-07-04 DIAGNOSIS — H40033 Anatomical narrow angle, bilateral: Secondary | ICD-10-CM | POA: Diagnosis not present

## 2019-07-04 DIAGNOSIS — S8011XD Contusion of right lower leg, subsequent encounter: Secondary | ICD-10-CM | POA: Diagnosis not present

## 2019-07-04 DIAGNOSIS — S134XXD Sprain of ligaments of cervical spine, subsequent encounter: Secondary | ICD-10-CM | POA: Diagnosis not present

## 2019-07-04 DIAGNOSIS — H2513 Age-related nuclear cataract, bilateral: Secondary | ICD-10-CM | POA: Diagnosis not present

## 2019-07-04 DIAGNOSIS — S8012XD Contusion of left lower leg, subsequent encounter: Secondary | ICD-10-CM | POA: Diagnosis not present

## 2019-07-06 DIAGNOSIS — S8012XD Contusion of left lower leg, subsequent encounter: Secondary | ICD-10-CM | POA: Diagnosis not present

## 2019-07-06 DIAGNOSIS — S8011XD Contusion of right lower leg, subsequent encounter: Secondary | ICD-10-CM | POA: Diagnosis not present

## 2019-07-06 DIAGNOSIS — S134XXD Sprain of ligaments of cervical spine, subsequent encounter: Secondary | ICD-10-CM | POA: Diagnosis not present

## 2019-07-11 DIAGNOSIS — S134XXD Sprain of ligaments of cervical spine, subsequent encounter: Secondary | ICD-10-CM | POA: Diagnosis not present

## 2019-07-11 DIAGNOSIS — S8012XD Contusion of left lower leg, subsequent encounter: Secondary | ICD-10-CM | POA: Diagnosis not present

## 2019-07-11 DIAGNOSIS — S8011XD Contusion of right lower leg, subsequent encounter: Secondary | ICD-10-CM | POA: Diagnosis not present

## 2019-07-13 DIAGNOSIS — S134XXD Sprain of ligaments of cervical spine, subsequent encounter: Secondary | ICD-10-CM | POA: Diagnosis not present

## 2019-07-13 DIAGNOSIS — S8011XD Contusion of right lower leg, subsequent encounter: Secondary | ICD-10-CM | POA: Diagnosis not present

## 2019-07-13 DIAGNOSIS — S8012XD Contusion of left lower leg, subsequent encounter: Secondary | ICD-10-CM | POA: Diagnosis not present

## 2019-07-18 DIAGNOSIS — S134XXD Sprain of ligaments of cervical spine, subsequent encounter: Secondary | ICD-10-CM | POA: Diagnosis not present

## 2019-07-18 DIAGNOSIS — S8012XD Contusion of left lower leg, subsequent encounter: Secondary | ICD-10-CM | POA: Diagnosis not present

## 2019-07-18 DIAGNOSIS — S8011XD Contusion of right lower leg, subsequent encounter: Secondary | ICD-10-CM | POA: Diagnosis not present

## 2019-07-21 DIAGNOSIS — S8012XD Contusion of left lower leg, subsequent encounter: Secondary | ICD-10-CM | POA: Diagnosis not present

## 2019-07-21 DIAGNOSIS — S134XXD Sprain of ligaments of cervical spine, subsequent encounter: Secondary | ICD-10-CM | POA: Diagnosis not present

## 2019-07-21 DIAGNOSIS — S8011XD Contusion of right lower leg, subsequent encounter: Secondary | ICD-10-CM | POA: Diagnosis not present

## 2019-07-25 DIAGNOSIS — S134XXD Sprain of ligaments of cervical spine, subsequent encounter: Secondary | ICD-10-CM | POA: Diagnosis not present

## 2019-07-25 DIAGNOSIS — S8012XD Contusion of left lower leg, subsequent encounter: Secondary | ICD-10-CM | POA: Diagnosis not present

## 2019-07-25 DIAGNOSIS — S8011XD Contusion of right lower leg, subsequent encounter: Secondary | ICD-10-CM | POA: Diagnosis not present

## 2019-07-27 DIAGNOSIS — S8011XD Contusion of right lower leg, subsequent encounter: Secondary | ICD-10-CM | POA: Diagnosis not present

## 2019-07-27 DIAGNOSIS — S134XXD Sprain of ligaments of cervical spine, subsequent encounter: Secondary | ICD-10-CM | POA: Diagnosis not present

## 2019-07-27 DIAGNOSIS — S8012XD Contusion of left lower leg, subsequent encounter: Secondary | ICD-10-CM | POA: Diagnosis not present

## 2019-08-01 DIAGNOSIS — S8012XD Contusion of left lower leg, subsequent encounter: Secondary | ICD-10-CM | POA: Diagnosis not present

## 2019-08-01 DIAGNOSIS — S8011XD Contusion of right lower leg, subsequent encounter: Secondary | ICD-10-CM | POA: Diagnosis not present

## 2019-08-01 DIAGNOSIS — S134XXD Sprain of ligaments of cervical spine, subsequent encounter: Secondary | ICD-10-CM | POA: Diagnosis not present

## 2019-08-03 DIAGNOSIS — S134XXD Sprain of ligaments of cervical spine, subsequent encounter: Secondary | ICD-10-CM | POA: Diagnosis not present

## 2019-08-03 DIAGNOSIS — S8012XD Contusion of left lower leg, subsequent encounter: Secondary | ICD-10-CM | POA: Diagnosis not present

## 2019-08-03 DIAGNOSIS — S8011XD Contusion of right lower leg, subsequent encounter: Secondary | ICD-10-CM | POA: Diagnosis not present

## 2019-08-04 ENCOUNTER — Other Ambulatory Visit: Payer: Self-pay

## 2019-08-04 ENCOUNTER — Encounter (HOSPITAL_COMMUNITY): Payer: Self-pay

## 2019-08-04 ENCOUNTER — Ambulatory Visit (HOSPITAL_COMMUNITY)
Admission: EM | Admit: 2019-08-04 | Discharge: 2019-08-04 | Disposition: A | Discharge status: Home / Self Care | Payer: Medicare Other | Attending: Emergency Medicine | Admitting: Emergency Medicine

## 2019-08-04 DIAGNOSIS — M25511 Pain in right shoulder: Secondary | ICD-10-CM | POA: Diagnosis not present

## 2019-08-04 MED ORDER — METHYLPREDNISOLONE SODIUM SUCC 125 MG IJ SOLR
INTRAMUSCULAR | Status: AC
  Filled 2019-08-04: qty 2

## 2019-08-04 MED ORDER — PREDNISONE 10 MG (21) PO TBPK: ORAL_TABLET | Freq: Every day | ORAL | 0 refills | Status: DC

## 2019-08-04 MED ORDER — METHYLPREDNISOLONE SODIUM SUCC 125 MG IJ SOLR
125.0000 mg | Freq: Once | INTRAMUSCULAR | Status: AC
  Administered 2019-08-04: 17:00:00 125 mg via INTRAMUSCULAR
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
## 2019-08-04 NOTE — Discharge Instructions (Addendum)
You were given an injection of a steroid called Solu-Medrol today.  Start taking the prednisone tomorrow as directed.  Follow-up with your orthopedist as scheduled.  Your blood pressure is elevated today at 162/84.  Please have this rechecked by your primary care provider in 2 weeks.
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
## 2019-08-08 DIAGNOSIS — S134XXD Sprain of ligaments of cervical spine, subsequent encounter: Secondary | ICD-10-CM | POA: Diagnosis not present

## 2019-08-08 DIAGNOSIS — S8012XD Contusion of left lower leg, subsequent encounter: Secondary | ICD-10-CM | POA: Diagnosis not present

## 2019-08-08 DIAGNOSIS — S8011XD Contusion of right lower leg, subsequent encounter: Secondary | ICD-10-CM | POA: Diagnosis not present

## 2019-08-09 DIAGNOSIS — S134XXD Sprain of ligaments of cervical spine, subsequent encounter: Secondary | ICD-10-CM | POA: Diagnosis not present

## 2019-08-14 ENCOUNTER — Other Ambulatory Visit: Payer: Self-pay | Admitting: Orthopaedic Surgery

## 2019-08-14 DIAGNOSIS — M542 Cervicalgia: Secondary | ICD-10-CM

## 2019-09-06 DIAGNOSIS — I10 Essential (primary) hypertension: Secondary | ICD-10-CM | POA: Diagnosis not present

## 2019-09-06 DIAGNOSIS — R05 Cough: Secondary | ICD-10-CM | POA: Diagnosis not present

## 2019-09-06 DIAGNOSIS — M5412 Radiculopathy, cervical region: Secondary | ICD-10-CM | POA: Diagnosis not present
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
## 2020-08-13 DIAGNOSIS — Z0000 Encounter for general adult medical examination without abnormal findings: Secondary | ICD-10-CM | POA: Diagnosis not present

## 2020-08-13 DIAGNOSIS — I10 Essential (primary) hypertension: Secondary | ICD-10-CM | POA: Diagnosis not present

## 2020-08-20 DIAGNOSIS — Z789 Other specified health status: Secondary | ICD-10-CM | POA: Diagnosis not present

## 2020-08-20 DIAGNOSIS — E669 Obesity, unspecified: Secondary | ICD-10-CM | POA: Diagnosis not present

## 2020-08-20 DIAGNOSIS — Z0000 Encounter for general adult medical examination without abnormal findings: Secondary | ICD-10-CM | POA: Diagnosis not present

## 2020-08-20 DIAGNOSIS — I10 Essential (primary) hypertension: Secondary | ICD-10-CM | POA: Diagnosis not present

## 2020-10-08 DIAGNOSIS — I10 Essential (primary) hypertension: Secondary | ICD-10-CM | POA: Diagnosis not present

## 2021-11-16 ENCOUNTER — Encounter (HOSPITAL_BASED_OUTPATIENT_CLINIC_OR_DEPARTMENT_OTHER): Payer: Self-pay | Admitting: Emergency Medicine

## 2021-11-16 ENCOUNTER — Other Ambulatory Visit: Payer: Self-pay

## 2021-11-16 ENCOUNTER — Emergency Department (HOSPITAL_BASED_OUTPATIENT_CLINIC_OR_DEPARTMENT_OTHER)
Admission: EM | Admit: 2021-11-16 | Discharge: 2021-11-16 | Disposition: A | Discharge status: Home / Self Care | Payer: Medicare Other | Attending: Emergency Medicine | Admitting: Emergency Medicine

## 2021-11-16 ENCOUNTER — Emergency Department (HOSPITAL_BASED_OUTPATIENT_CLINIC_OR_DEPARTMENT_OTHER): Payer: Medicare Other

## 2021-11-16 DIAGNOSIS — E876 Hypokalemia: Secondary | ICD-10-CM | POA: Diagnosis not present

## 2021-11-16 DIAGNOSIS — N939 Abnormal uterine and vaginal bleeding, unspecified: Secondary | ICD-10-CM | POA: Diagnosis not present

## 2021-11-16 DIAGNOSIS — N950 Postmenopausal bleeding: Secondary | ICD-10-CM | POA: Diagnosis not present

## 2021-11-16 DIAGNOSIS — I10 Essential (primary) hypertension: Secondary | ICD-10-CM | POA: Diagnosis not present

## 2021-11-16 DIAGNOSIS — N3000 Acute cystitis without hematuria: Secondary | ICD-10-CM | POA: Insufficient documentation

## 2021-11-16 LAB — CBC WITH DIFFERENTIAL/PLATELET
Abs Immature Granulocytes: 0.05 10*3/uL (ref 0.00–0.07)
Basophils Absolute: 0.1 10*3/uL (ref 0.0–0.1)
Basophils Relative: 1 %
Eosinophils Absolute: 0.1 10*3/uL (ref 0.0–0.5)
Eosinophils Relative: 1 %
HCT: 39.3 % (ref 36.0–46.0)
Hemoglobin: 12.8 g/dL (ref 12.0–15.0)
Immature Granulocytes: 0 %
Lymphocytes Relative: 22 %
Lymphs Abs: 3.3 10*3/uL (ref 0.7–4.0)
MCH: 29.2 pg (ref 26.0–34.0)
MCHC: 32.6 g/dL (ref 30.0–36.0)
MCV: 89.5 fL (ref 80.0–100.0)
Monocytes Absolute: 0.9 10*3/uL (ref 0.1–1.0)
Monocytes Relative: 6 %
Neutro Abs: 10.8 10*3/uL — ABNORMAL HIGH (ref 1.7–7.7)
Neutrophils Relative %: 70 %
Platelets: 281 10*3/uL (ref 150–400)
RBC: 4.39 MIL/uL (ref 3.87–5.11)
RDW: 13.9 % (ref 11.5–15.5)
WBC: 15.2 10*3/uL — ABNORMAL HIGH (ref 4.0–10.5)
nRBC: 0 % (ref 0.0–0.2)

## 2021-11-16 LAB — URINALYSIS, ROUTINE W REFLEX MICROSCOPIC
Bilirubin Urine: NEGATIVE
Glucose, UA: NEGATIVE mg/dL
Ketones, ur: NEGATIVE mg/dL
Nitrite: POSITIVE — AB
Protein, ur: NEGATIVE mg/dL
Specific Gravity, Urine: 1.014 (ref 1.005–1.030)
pH: 6.5 (ref 5.0–8.0)

## 2021-11-16 LAB — WET PREP, GENITAL
Clue Cells Wet Prep HPF POC: NONE SEEN
Sperm: NONE SEEN
Trich, Wet Prep: NONE SEEN
WBC, Wet Prep HPF POC: <10 (ref <10)

## 2021-11-16 LAB — PROTIME-INR
INR: 1 (ref 0.8–1.2)
Prothrombin Time: 13.2 seconds (ref 11.4–15.2)

## 2021-11-16 LAB — BASIC METABOLIC PANEL
Anion gap: 11 (ref 5–15)
BUN: 13 mg/dL (ref 8–23)
CO2: 25 mmol/L (ref 22–32)
Calcium: 9.2 mg/dL (ref 8.9–10.3)
Chloride: 105 mmol/L (ref 98–111)
Creatinine, Ser: 0.72 mg/dL (ref 0.44–1.00)
GFR, Estimated: >60 mL/min (ref >60)
Glucose, Bld: 104 mg/dL — ABNORMAL HIGH (ref 70–99)
Potassium: 3.1 mmol/L — ABNORMAL LOW (ref 3.5–5.1)
Sodium: 141 mmol/L (ref 135–145)

## 2021-11-16 MED ORDER — CEPHALEXIN 500 MG PO CAPS: 500.0000 mg | ORAL_CAPSULE | Freq: Two times a day (BID) | ORAL | 0 refills | Status: AC

## 2021-11-16 MED ORDER — FLUCONAZOLE 200 MG PO TABS: 200.0000 mg | ORAL_TABLET | Freq: Every day | ORAL | 0 refills | Status: AC

## 2021-11-16 MED ORDER — POTASSIUM CHLORIDE CRYS ER 20 MEQ PO TBCR: 40.0000 meq | EXTENDED_RELEASE_TABLET | Freq: Every day | ORAL | 0 refills | Status: DC

## 2021-11-16 NOTE — ED Triage Notes (Signed)
Vaginal bleeding on and off X 1 month, started getting worse Friday her for eval.

## 2021-11-16 NOTE — Discharge Instructions (Signed)
You were seen in the emerge department today with vaginal bleeding.  Your ultrasound shows some abnormal thickening of the uterine lining.  This is unusual at your age and you need to see an OB/GYN.  Have listed the name of a provider practice on this paperwork for you to call and schedule the next available appointment.  Your personal OB/GYN should be able to handle this as well.  I am treating you for urinary tract infection.  Please take the antibiotics as directed.  He also had a small amount of yeast on your wet prep today and so I called in 2 tablets for yeast infection.  Your potassium is also just slightly low and so we will be replacing that with pills for the next 4 days.  Please have your primary care doctor in the loop regarding your ED visit and ask them to repeat your potassium in the next week to make sure it is come back to normal.

## 2021-11-16 NOTE — ED Provider Notes (Signed)
Emergency Department Provider Note   I have reviewed the triage vital signs and the nursing notes.   HISTORY  Chief Complaint Vaginal Bleeding   HPI Destiny Wheeler is a 71 y.o. female with PMH of HTN presents to the ED with intermittent vaginal spotting for the last several weeks. Symptoms seem to be worsening. No large volume bleeding. No weakness, SOB, or lightheadedness.  Denies any severe abdominal pain.  Prior history of vaginal bleeding.  No dysuria, hesitancy, urgency. No GU cancer history.    Past Medical History:  Diagnosis Date   Hypertension     Review of Systems  Constitutional: No fever/chills Cardiovascular: Denies chest pain. Respiratory: Denies shortness of breath. Gastrointestinal: No abdominal pain.  No nausea, no vomiting.  No diarrhea.  No constipation. Genitourinary: Negative for dysuria. ? Blood in the urine vs vaginal bleeding.  Musculoskeletal: Negative for back pain. Skin: Negative for rash. Neurological: Negative for headaches.   ____________________________________________   PHYSICAL EXAM:  VITAL SIGNS: ED Triage Vitals  Enc Vitals Group     BP 11/16/21 1421 (!) 165/98     Pulse Rate 11/16/21 1421 100     Resp 11/16/21 1421 20     Temp 11/16/21 1421 98 F (36.7 C)     Temp Source 11/16/21 1421 Oral     SpO2 11/16/21 1421 100 %     Weight 11/16/21 1419 240 lb (108.9 kg)     Height 11/16/21 1419 5\' 8"  (1.727 m)   Constitutional: Alert and oriented. Well appearing and in no acute distress. Eyes: Conjunctivae are normal.  Head: Atraumatic. Nose: No congestion/rhinnorhea. Mouth/Throat: Mucous membranes are moist.  Neck: No stridor.   Cardiovascular: Normal rate, regular rhythm. Good peripheral circulation. Grossly normal heart sounds.   Respiratory: Normal respiratory effort.  No retractions. Lungs CTAB. Gastrointestinal: Soft and nontender. No distention.  Genitourinary: Exam performed with patient's verbal consent and nurse  chaperone.  Normal external genitalia.  No large cervical mass or obvious source of bleeding on pelvic exam. No discharge.  Musculoskeletal:  No gross deformities of extremities. Neurologic:  Normal speech and language.  Skin:  Skin is warm, dry and intact. No rash noted.  ____________________________________________   LABS (all labs ordered are listed, but only abnormal results are displayed)  Labs Reviewed  WET PREP, GENITAL - Abnormal; Notable for the following components:      Result Value   Yeast Wet Prep HPF POC PRESENT (*)    All other components within normal limits  BASIC METABOLIC PANEL - Abnormal; Notable for the following components:   Potassium 3.1 (*)    Glucose, Bld 104 (*)    All other components within normal limits  URINALYSIS, ROUTINE W REFLEX MICROSCOPIC - Abnormal; Notable for the following components:   APPearance HAZY (*)    Hgb urine dipstick SMALL (*)    Nitrite POSITIVE (*)    Leukocytes,Ua SMALL (*)    Bacteria, UA MANY (*)    All other components within normal limits  CBC WITH DIFFERENTIAL/PLATELET - Abnormal; Notable for the following components:   WBC 15.2 (*)    Neutro Abs 10.8 (*)    All other components within normal limits  URINE CULTURE  PROTIME-INR  CBC WITH DIFFERENTIAL/PLATELET   ____________________________________________  RADIOLOGY  US PELVIC COMPLETE WITH TRANSVAGINAL  Result Date: 11/16/2021 CLINICAL DATA:  Postmenopausal bleeding for 1 month, sporadic. EXAM: TRANSABDOMINAL AND TRANSVAGINAL ULTRASOUND OF PELVIS TECHNIQUE: Both transabdominal and transvaginal ultrasound examinations of the pelvis were  performed. Transabdominal technique was performed for global imaging of the pelvis including uterus, ovaries, adnexal regions, and pelvic cul-de-sac. It was necessary to proceed with endovaginal exam following the transabdominal exam to visualize the endometrial complex to an adequate degree. COMPARISON:  None FINDINGS: Uterus  Measurements: 7 x 3.8 x 5 cm = volume: 69 mL. No fibroids or other mass visualized. Endometrium Thickness: 16 mm. Scattered small cystic foci within the thickened endometrium. Right ovary Not visualized.  No adnexal mass or free fluid demonstrated. Left ovary Not visualized.  No adnexal mass or free fluid demonstrated. Other findings No abnormal free fluid. IMPRESSION: 1. Abnormally thickened endometrium. This could represent endometrial hyperplasia or neoplasia. Recommend tissue sampling. 2. Ovaries are not visualized. No mass or free fluid demonstrated within either adnexal region. Electronically Signed   By: Franki Cabot M.D.   On: 11/16/2021 16:40    ____________________________________________   PROCEDURES  Procedure(s) performed:   Procedures  None  ____________________________________________   INITIAL IMPRESSION / ASSESSMENT AND PLAN / ED COURSE  Pertinent labs & imaging results that were available during my care of the patient were reviewed by me and considered in my medical decision making (see chart for details).   This patient is Presenting for Evaluation of vaginal bleeding, which does require a range of treatment options, and is a complaint that involves a high risk of morbidity and mortality.  The Differential Diagnoses include dysfunctional uterine bleeding, fibroids, uterine cancer, urinary tract infection.   I did obtain Additional Historical Information from patient's daughter at bedside.   I decided to review pertinent External Data, and in summary last visit in our system was unrelated UC visit in 2020. Marland Kitchen   Clinical Laboratory Tests Ordered, included labs showing mild leukocytosis to 15 with no anemia.  Hemoglobin of 12.8.  Very slight hypokalemia at 3.1.  Kidney function is normal.  UA consistent with nitrite positive and many bacteria. Will send for culture.   Radiologic Tests Ordered, included Pelvic US. I independently interpreted the images and agree with  radiology interpretation.   Cardiac Monitor Tracing which shows NSR.   Social Determinants of Health Risk no smoking history.   Medical Decision Making: Summary:  Patient presents emergency department with intermittent vaginal bleeding over the past several weeks.  Some concern for possible blood in the urine as well but that has been more recent.  Pelvic exam shows no obvious source of bleeding.  Pelvic ultrasound shows abnormal/thickened endometrium recommending direct tissue sampling.  Patient has an OB but will provide the name for the on-call provider as well.  She will need OB evaluation in the outpatient setting for the above sampling recommendation.  UA does show some evidence of infection although patient having few symptoms.  Plan for antibiotics.  Reevaluation with update and discussion with patient.  We discussed the pelvic ultrasound and recommendation for tissue sampling with an OB/GYN.  She has an OB/GYN and will call them but I also listed the name for the [facility] practice in case she needs for follow-up.  Plan for replacement of potassium.  Some yeast on wet prep and so we will start fluconazole along with Keflex for urinary tract infection.  No findings on exam or history to strongly suspect developing pyelonephritis. Discussed ED return precautions.   Disposition: discharge   ____________________________________________  FINAL CLINICAL IMPRESSION(S) / ED DIAGNOSES  Final diagnoses:  Vaginal bleeding  Acute cystitis without hematuria  Hypokalemia     NEW OUTPATIENT MEDICATIONS STARTED DURING THIS  VISIT:  New Prescriptions   CEPHALEXIN (KEFLEX) 500 MG CAPSULE    Take 1 capsule (500 mg total) by mouth 2 (two) times daily for 7 days.   FLUCONAZOLE (DIFLUCAN) 200 MG TABLET    Take 1 tablet (200 mg total) by mouth daily for 2 doses.   POTASSIUM CHLORIDE SA (KLOR-CON M) 20 MEQ TABLET    Take 2 tablets (40 mEq total) by mouth daily for 4 days.    Note:  This document was  prepared using Dragon voice recognition software and may include unintentional dictation errors.  Nanda Quinton, MD, [facility]    Dmitry Macomber, [facility], MD 11/16/21 [phone]

## 2021-11-16 NOTE — ED Notes (Signed)
Patient given discharge instructions. Questions were answered. Patient verbalized understanding of discharge instructions and care at home. ? ?Patient discharged with family ?

## 2021-11-19 LAB — URINE CULTURE: Culture: >=100000 — AB

## 2021-11-20 ENCOUNTER — Telehealth: Payer: Self-pay | Admitting: *Deleted

## 2021-11-20 NOTE — Telephone Encounter (Signed)
Post ED Visit - Positive Culture Follow-up ° °Culture report reviewed by antimicrobial stewardship pharmacist: °[facility] °[] Nathan Batchelder, Pharm.D. °[] Jeremy Frens, Pharm.D., BCPS AQ-ID °[] Mike Maccia, Pharm.D., BCPS °[] Elizabeth Martin, Pharm.D., BCPS °[] Minh Pham, Pharm.D., BCPS, AAHIVP °[] Michelle Turner, Pharm.D., BCPS, AAHIVP °[] Rachel Rumbarger, PharmD, BCPS °[] Thuy Dang, PharmD, BCPS °[] Alison Masters, PharmD, BCPS °[] Erin Deja, PharmD °[] Catherine Pierce, PharmD, BCPS °[] Benjamin Mancheril, PharmD ° °[facility] °[] Michelle Bell, PharmD °[] Jigna Gadhia, PharmD °[] Nikola Glogovac, PharmD °[] Terri Green, Rph °[] Rachel (Ellen) Jackson, PharmD °[] Justin Legge, PharmD °[] Michelle Lilliston, PharmD °[] Anh Pham, PharmD °[] Leann Poindexter, PharmD °[] Christine Shade, PharmD °[] Mary Swayne, PharmD °[] Erin Williamson, PharmD °[] Drew Wofford, PharmD ° ° °Positive urine culture °Treated with Cephalexin, organism sensitive to the same and no further patient follow-up is required at this time.  Joshua Geiple, PA ° °Destiny Wheeler °11/20/2021, 11:44 AM °  °

## 2021-12-16 ENCOUNTER — Encounter (HOSPITAL_COMMUNITY): Payer: Self-pay | Admitting: Radiology

## 2021-12-31 DIAGNOSIS — I10 Essential (primary) hypertension: Secondary | ICD-10-CM | POA: Diagnosis not present

## 2022-01-07 DIAGNOSIS — Z8719 Personal history of other diseases of the digestive system: Secondary | ICD-10-CM | POA: Diagnosis not present

## 2022-01-07 DIAGNOSIS — Z0000 Encounter for general adult medical examination without abnormal findings: Secondary | ICD-10-CM | POA: Diagnosis not present

## 2022-01-07 DIAGNOSIS — N939 Abnormal uterine and vaginal bleeding, unspecified: Secondary | ICD-10-CM | POA: Diagnosis not present

## 2022-01-07 DIAGNOSIS — N3000 Acute cystitis without hematuria: Secondary | ICD-10-CM | POA: Diagnosis not present

## 2022-01-19 ENCOUNTER — Encounter: Payer: Self-pay | Admitting: Obstetrics and Gynecology

## 2022-01-19 ENCOUNTER — Ambulatory Visit: Payer: Medicare Other | Admitting: Obstetrics and Gynecology

## 2022-01-19 DIAGNOSIS — N950 Postmenopausal bleeding: Secondary | ICD-10-CM

## 2022-01-19 DIAGNOSIS — I10 Essential (primary) hypertension: Secondary | ICD-10-CM | POA: Insufficient documentation

## 2022-01-19 DIAGNOSIS — N939 Abnormal uterine and vaginal bleeding, unspecified: Secondary | ICD-10-CM | POA: Insufficient documentation

## 2022-01-19 DIAGNOSIS — M545 Low back pain, unspecified: Secondary | ICD-10-CM | POA: Insufficient documentation

## 2022-01-19 DIAGNOSIS — N882 Stricture and stenosis of cervix uteri: Secondary | ICD-10-CM | POA: Diagnosis not present

## 2022-01-19 DIAGNOSIS — Z8719 Personal history of other diseases of the digestive system: Secondary | ICD-10-CM | POA: Insufficient documentation

## 2022-01-19 DIAGNOSIS — R9389 Abnormal findings on diagnostic imaging of other specified body structures: Secondary | ICD-10-CM

## 2022-01-19 DIAGNOSIS — E669 Obesity, unspecified: Secondary | ICD-10-CM | POA: Insufficient documentation

## 2022-01-19 NOTE — Progress Notes (Signed)
Nov 21 PMB, previously D and C 2012 ? ? ?CC: postmenopausal bleeding ?Subjective:  ? ? Patient ID: Destiny Wheeler, female    DOB: April 05, 1951, 71 y.o.   MRN: [id number] ? ?HPI ?71 yo G5P5, SVD x 5 , seen for postmenopausal spotting.  Pt has had this spotting since  November 2021.  The patient has had a similar issue with PMB in 2012 and had a D and C for evaluation.  During the evaluation, it was noted that the patient had severe cervical stenosis.  The pathology showed benign tissue.  Ultrasound from February showed 16 mm endometrial stripe. ? ? ?Review of Systems ? ?   ?Objective:  ? Physical Exam ?Vitals:  ? 01/19/22 1454  ?BP: 140/66  ?Pulse: 84  ? ?SVE: obvious light vaginal bleeding on external genitalia. ?Minimal blood in vagina, stenotic cervix noted. ?CLINICAL DATA:  Postmenopausal bleeding for 1 month, sporadic. ?  ?EXAM: ?TRANSABDOMINAL AND TRANSVAGINAL ULTRASOUND OF PELVIS ?  ?TECHNIQUE: ?Both transabdominal and transvaginal ultrasound examinations of the ?pelvis were performed. Transabdominal technique was performed for ?global imaging of the pelvis including uterus, ovaries, adnexal ?regions, and pelvic cul-de-sac. It was necessary to proceed with ?endovaginal exam following the transabdominal exam to visualize the ?endometrial complex to an adequate degree. ?  ?COMPARISON:  None ?  ?FINDINGS: ?Uterus ?  ?Measurements: 7 x 3.8 x 5 cm = volume: 69 mL. No fibroids or other ?mass visualized. ?  ?Endometrium ?  ?Thickness: 16 mm. Scattered small cystic foci within the thickened ?endometrium. ?  ?Right ovary ?  ?Not visualized.  No adnexal mass or free fluid demonstrated. ?  ?Left ovary ?  ?Not visualized.  No adnexal mass or free fluid demonstrated. ?  ?Other findings ?  ?No abnormal free fluid. ?  ?IMPRESSION: ?1. Abnormally thickened endometrium. This could represent ?endometrial hyperplasia or neoplasia. Recommend tissue sampling. ?2. Ovaries are not visualized. No mass or free fluid  demonstrated ?within either adnexal region. ?  ?Scan reviewed personally by MD ? ?   ?Assessment & Plan:  ? ?1. Abnormal vaginal bleeding with endometrial thickness greater than 5 mm present on transvaginal ultrasound in postmenopausal patient ?Pt given options for in office endometrial biopsy with cytotec cervical ripening or hysteroscopy D and C with ultrasound guidance.  My preference was hysteroscopy D and C due to previous difficulty obtaining a sample from previous note.  Pt was unsure which route she wanted to go and wanted time to think and make a decision.  Her daughter was present and agrees with the plan.  If no response in 2 weeks, will reach out to the patient for her decision. ? ?2. Cervical stenosis (uterine cervix) ?Regardless of approach, pt will need cytotec cervical ripening, likely 200 mcg the night before and the morning of procedure ? ?Moderate decision making ? ?Warden Fillers, MD ?Faculty Attending, [facility]  ?

## 2022-01-20 DIAGNOSIS — N882 Stricture and stenosis of cervix uteri: Secondary | ICD-10-CM | POA: Insufficient documentation

## 2022-02-05 DIAGNOSIS — E119 Type 2 diabetes mellitus without complications: Secondary | ICD-10-CM | POA: Diagnosis not present

## 2022-02-05 DIAGNOSIS — I10 Essential (primary) hypertension: Secondary | ICD-10-CM | POA: Diagnosis not present

## 2022-02-25 DIAGNOSIS — K529 Noninfective gastroenteritis and colitis, unspecified: Secondary | ICD-10-CM | POA: Diagnosis not present

## 2022-02-25 DIAGNOSIS — Z1211 Encounter for screening for malignant neoplasm of colon: Secondary | ICD-10-CM | POA: Diagnosis not present

## 2022-03-12 ENCOUNTER — Telehealth: Payer: Self-pay | Admitting: Family Medicine

## 2022-03-12 NOTE — Telephone Encounter (Signed)
I called patient back to see what questions she had. Per Dr. Donavan Foil' note on 01/19/22 patient was given options for in office endometrial biopsy or hysteroscopy D&C with ultrasound guidance. I asked patient if she had decided which route she wanted to go. Patient states she wants the hysteroscopy D&C as recommended by Dr. Donavan Foil. I explained to patient I would notify Dr. Donavan Foil of this choice and we would follow up with her about what the next steps are. Patient verbalized understanding and denies any other questions. Inbasket message sent to Dr. Donavan Foil.   Alesia Richards, RN 03/12/22

## 2022-03-12 NOTE — Telephone Encounter (Signed)
Patient called regarding her getting a Biopsy said she can't remember what the last Dr told her.

## 2022-03-17 ENCOUNTER — Telehealth: Payer: Self-pay

## 2022-03-17 NOTE — Telephone Encounter (Signed)
Called pt; VM left stating I am calling with update from Dr. Donavan Foil, encouraged pt to call back or wait for call from surgery scheduler.

## 2022-03-17 NOTE — Telephone Encounter (Signed)
-----   Message from Cline Crock, RN sent at 03/17/2022  8:11 AM EDT ----- Regarding: FW: Wants Hysteroscopy D&C  ----- Message ----- From: Warden Fillers, MD Sent: 03/14/2022  12:06 AM EDT To: Cline Crock, RN Subject: RE: Wants Hysteroscopy D&C                     Please let the patient know I have forwarded the request to the surgical scheduler ----- Message ----- From: Cline Crock, RN Sent: 03/12/2022   2:58 PM EDT To: Warden Fillers, MD Subject: Wants Hysteroscopy D&C                         Hi Dr. Donavan Foil,  You saw this patient on 01/19/22 for abnormal vaginal bleeding. She called today stating she would like to have the hysteroscopy D&C as you guys discussed.   Thanks,  Nolberto Hanlon, RN

## 2022-03-18 ENCOUNTER — Telehealth: Payer: Self-pay | Admitting: General Practice

## 2022-03-18 ENCOUNTER — Telehealth: Payer: Self-pay | Admitting: Family Medicine

## 2022-03-18 NOTE — Telephone Encounter (Signed)
Patient called into office stating she is returning our phone call. Told patient we were just calling to inform her that Dr Elgie Congo has notified the surgery scheduler of her request and she should receive a letter in the mail within the next week with surgery date and information. Patient verbalized understanding.

## 2022-03-18 NOTE — Telephone Encounter (Signed)
Called patient to schedule GYN appt

## 2022-03-24 ENCOUNTER — Other Ambulatory Visit: Payer: Self-pay | Admitting: Obstetrics and Gynecology

## 2022-03-24 ENCOUNTER — Other Ambulatory Visit (HOSPITAL_COMMUNITY): Payer: Self-pay | Admitting: Obstetrics and Gynecology

## 2022-03-24 DIAGNOSIS — N950 Postmenopausal bleeding: Secondary | ICD-10-CM

## 2022-03-24 DIAGNOSIS — N882 Stricture and stenosis of cervix uteri: Secondary | ICD-10-CM

## 2022-03-25 ENCOUNTER — Telehealth: Payer: Self-pay | Admitting: General Practice

## 2022-03-25 NOTE — Telephone Encounter (Signed)
Called patient, no answer- left message to call us back concerning information we need to review with her.

## 2022-03-25 NOTE — Telephone Encounter (Addendum)
-----   Message from Olevia Bowens sent at 03/24/2022  3:58 PM EDT ----- Regarding: FW: Surgery 07/11 See below, please work with patient to make sure she has clearance from her PCP for surgery for her HTN and diabetes.

## 2022-03-26 NOTE — Telephone Encounter (Signed)
I attempted to call patient. Patient did not answer, voicemail left asking patient to call us back.   Alesia Richards, RN 03/26/22

## 2022-03-26 NOTE — Telephone Encounter (Signed)
Call transferred from front office with patient on the line. I notified patient that she needs to have clearance from her PCP for her HTN and diabetes in order for her to get surgery. Patient verbalized understanding and stated she would set up an appointment with her PCP for surgery clearance.   Alesia Richards, RN 03/26/22

## 2022-03-30 DIAGNOSIS — E119 Type 2 diabetes mellitus without complications: Secondary | ICD-10-CM | POA: Diagnosis not present

## 2022-03-30 DIAGNOSIS — N939 Abnormal uterine and vaginal bleeding, unspecified: Secondary | ICD-10-CM | POA: Diagnosis not present

## 2022-03-30 DIAGNOSIS — I10 Essential (primary) hypertension: Secondary | ICD-10-CM | POA: Diagnosis not present

## 2022-03-30 DIAGNOSIS — Z01818 Encounter for other preprocedural examination: Secondary | ICD-10-CM | POA: Diagnosis not present

## 2022-04-16 ENCOUNTER — Other Ambulatory Visit: Payer: Self-pay

## 2022-04-16 ENCOUNTER — Encounter: Payer: Self-pay | Admitting: Obstetrics and Gynecology

## 2022-04-16 ENCOUNTER — Ambulatory Visit: Payer: Medicare Other | Admitting: Obstetrics and Gynecology

## 2022-04-16 VITALS — BP 139/67 | HR 98 | Wt 230.8 lb

## 2022-04-16 DIAGNOSIS — N882 Stricture and stenosis of cervix uteri: Secondary | ICD-10-CM | POA: Diagnosis not present

## 2022-04-16 DIAGNOSIS — N950 Postmenopausal bleeding: Secondary | ICD-10-CM

## 2022-04-16 DIAGNOSIS — R9389 Abnormal findings on diagnostic imaging of other specified body structures: Secondary | ICD-10-CM | POA: Diagnosis not present

## 2022-04-16 DIAGNOSIS — Z01818 Encounter for other preprocedural examination: Secondary | ICD-10-CM | POA: Diagnosis not present

## 2022-04-16 DIAGNOSIS — E119 Type 2 diabetes mellitus without complications: Secondary | ICD-10-CM | POA: Insufficient documentation

## 2022-04-16 MED ORDER — MISOPROSTOL 200 MCG PO TABS: ORAL_TABLET | ORAL | 0 refills | Status: DC

## 2022-04-16 NOTE — H&P (View-Only) (Signed)
OB/GYN Pre-Op History and Physical  Destiny Wheeler is a 71 y.o. [id number] presenting for preoperative visit for hysteroscopy dilation and curettage.  Pt has had intermittent postmenopausal bleeding and thickened endometrial stripe.       Past Medical History:  Diagnosis Date   Hypertension     No past surgical history on file.  OB History  Gravida Para Term Preterm AB Living  5 5 5  0 0 5  SAB IAB Ectopic Multiple Live Births  0 0 0 0 5    # Outcome Date GA Lbr Len/2nd Weight Sex Delivery Anes PTL Lv  5 Term      Vag-Spont     4 Term      Vag-Spont     3 Term      Vag-Spont     2 Term      Vag-Spont     1 Term      Vag-Spont       Social History   Socioeconomic History   Marital status: Divorced    Spouse name: Not on file   Number of children: Not on file   Years of education: Not on file   Highest education level: Not on file  Occupational History   Not on file  Tobacco Use   Smoking status: Never   Smokeless tobacco: Never  Vaping Use   Vaping Use: Never used  Substance and Sexual Activity   Alcohol use: No   Drug use: No   Sexual activity: Never  Other Topics Concern   Not on file  Social History Narrative   Not on file   Social Determinants of Health   Financial Resource Strain: Not on file  Food Insecurity: No Food Insecurity (01/19/2022)   Hunger Vital Sign    Worried About Running Out of Food in the Last Year: Never true    Ran Out of Food in the Last Year: Never true  Transportation Needs: No Transportation Needs (01/19/2022)   PRAPARE - 03/21/2022 (Medical): No    Lack of Transportation (Non-Medical): No  Physical Activity: Not on file  Stress: Not on file  Social Connections: Not on file    No family history on file.  (Not in a hospital admission)   Allergies  Allergen Reactions   Iodine     Reports face swelling from IV contrast    Review of Systems: Negative except for what is mentioned in  HPI.     Physical Exam: BP 139/67   Pulse 98   Wt 230 lb 12.8 oz (104.7 kg)   BMI 35.09 kg/m  CONSTITUTIONAL: Well-developed, well-nourished female in no acute distress.  HENT:  Normocephalic, atraumatic, External right and left ear normal. Oropharynx is clear and moist EYES: Conjunctivae and EOM are normal.  NECK: Normal range of motion, supple, no masses SKIN: Skin is warm and dry. No rash noted. Not diaphoretic. No erythema. No pallor. NEUROLGIC: Alert and oriented to person, place, and time. Normal reflexes, muscle tone coordination. No cranial nerve deficit noted. PSYCHIATRIC: Normal mood and affect. Normal behavior. Normal judgment and thought content. CARDIOVASCULAR: Normal heart rate noted, regular rhythm RESPIRATORY: Effort and breath sounds normal, no problems with respiration noted ABDOMEN: Soft, nontender, nondistended, gravid.  PELVIC: normal external genitalia, normal vagina, stenotic appearing cervix MUSCULOSKELETAL: Normal range of motion. No edema and no tenderness. 2+ distal pulses.   Pertinent Labs/Studies:   CLINICAL DATA:  Postmenopausal bleeding for 1 month, sporadic.  EXAM: TRANSABDOMINAL AND TRANSVAGINAL ULTRASOUND OF PELVIS   TECHNIQUE: Both transabdominal and transvaginal ultrasound examinations of the pelvis were performed. Transabdominal technique was performed for global imaging of the pelvis including uterus, ovaries, adnexal regions, and pelvic cul-de-sac. It was necessary to proceed with endovaginal exam following the transabdominal exam to visualize the endometrial complex to an adequate degree.   COMPARISON:  None   FINDINGS: Uterus   Measurements: 7 x 3.8 x 5 cm = volume: 69 mL. No fibroids or other mass visualized.   Endometrium   Thickness: 16 mm. Scattered small cystic foci within the thickened endometrium.   Right ovary   Not visualized.  No adnexal mass or free fluid demonstrated.   Left ovary   Not visualized.  No  adnexal mass or free fluid demonstrated.   Other findings   No abnormal free fluid.   IMPRESSION: 1. Abnormally thickened endometrium. This could represent endometrial hyperplasia or neoplasia. Recommend tissue sampling. 2. Ovaries are not visualized. No mass or free fluid demonstrated within either adnexal region.       Assessment and Plan :Destiny Wheeler is a 71 y.o. G5P5005 here for preoperative visit for hysteroscopy dilation and curettage 2/2 postmenopausal bleeding and thickened endometrial stripe.     Plan for real time ultrasound guidance during the case Will premedicate with vaginal misoprostol for cervical ripening Risks and benefits of the procedure given including bleeding, infection, involvement of other organs as well as uterine perforation. NPO Admission labs ordered VS Q4 Preop clearance seen in care everywhere   Stewart Sasaki, M.D. Attending Obstetrician & Gynecologist, [facility], [facility]  

## 2022-04-16 NOTE — Progress Notes (Signed)
OB/GYN Pre-Op History and Physical  Destiny Wheeler is a 71 y.o. [id number] presenting for preoperative visit for hysteroscopy dilation and curettage.  Pt has had intermittent postmenopausal bleeding and thickened endometrial stripe.       Past Medical History:  Diagnosis Date   Hypertension     No past surgical history on file.  OB History  Gravida Para Term Preterm AB Living  5 5 5  0 0 5  SAB IAB Ectopic Multiple Live Births  0 0 0 0 5    # Outcome Date GA Lbr Len/2nd Weight Sex Delivery Anes PTL Lv  5 Term      Vag-Spont     4 Term      Vag-Spont     3 Term      Vag-Spont     2 Term      Vag-Spont     1 Term      Vag-Spont       Social History   Socioeconomic History   Marital status: Divorced    Spouse name: Not on file   Number of children: Not on file   Years of education: Not on file   Highest education level: Not on file  Occupational History   Not on file  Tobacco Use   Smoking status: Never   Smokeless tobacco: Never  Vaping Use   Vaping Use: Never used  Substance and Sexual Activity   Alcohol use: No   Drug use: No   Sexual activity: Never  Other Topics Concern   Not on file  Social History Narrative   Not on file   Social Determinants of Health   Financial Resource Strain: Not on file  Food Insecurity: No Food Insecurity (01/19/2022)   Hunger Vital Sign    Worried About Running Out of Food in the Last Year: Never true    Ran Out of Food in the Last Year: Never true  Transportation Needs: No Transportation Needs (01/19/2022)   PRAPARE - 03/21/2022 (Medical): No    Lack of Transportation (Non-Medical): No  Physical Activity: Not on file  Stress: Not on file  Social Connections: Not on file    No family history on file.  (Not in a hospital admission)   Allergies  Allergen Reactions   Iodine     Reports face swelling from IV contrast    Review of Systems: Negative except for what is mentioned in  HPI.     Physical Exam: BP 139/67   Pulse 98   Wt 230 lb 12.8 oz (104.7 kg)   BMI 35.09 kg/m  CONSTITUTIONAL: Well-developed, well-nourished female in no acute distress.  HENT:  Normocephalic, atraumatic, External right and left ear normal. Oropharynx is clear and moist EYES: Conjunctivae and EOM are normal.  NECK: Normal range of motion, supple, no masses SKIN: Skin is warm and dry. No rash noted. Not diaphoretic. No erythema. No pallor. NEUROLGIC: Alert and oriented to person, place, and time. Normal reflexes, muscle tone coordination. No cranial nerve deficit noted. PSYCHIATRIC: Normal mood and affect. Normal behavior. Normal judgment and thought content. CARDIOVASCULAR: Normal heart rate noted, regular rhythm RESPIRATORY: Effort and breath sounds normal, no problems with respiration noted ABDOMEN: Soft, nontender, nondistended, gravid.  PELVIC: normal external genitalia, normal vagina, stenotic appearing cervix MUSCULOSKELETAL: Normal range of motion. No edema and no tenderness. 2+ distal pulses.   Pertinent Labs/Studies:   CLINICAL DATA:  Postmenopausal bleeding for 1 month, sporadic.  EXAM: TRANSABDOMINAL AND TRANSVAGINAL ULTRASOUND OF PELVIS   TECHNIQUE: Both transabdominal and transvaginal ultrasound examinations of the pelvis were performed. Transabdominal technique was performed for global imaging of the pelvis including uterus, ovaries, adnexal regions, and pelvic cul-de-sac. It was necessary to proceed with endovaginal exam following the transabdominal exam to visualize the endometrial complex to an adequate degree.   COMPARISON:  None   FINDINGS: Uterus   Measurements: 7 x 3.8 x 5 cm = volume: 69 mL. No fibroids or other mass visualized.   Endometrium   Thickness: 16 mm. Scattered small cystic foci within the thickened endometrium.   Right ovary   Not visualized.  No adnexal mass or free fluid demonstrated.   Left ovary   Not visualized.  No  adnexal mass or free fluid demonstrated.   Other findings   No abnormal free fluid.   IMPRESSION: 1. Abnormally thickened endometrium. This could represent endometrial hyperplasia or neoplasia. Recommend tissue sampling. 2. Ovaries are not visualized. No mass or free fluid demonstrated within either adnexal region.       Assessment and Plan :Destiny Wheeler is a 71 y.o. [id number] here for preoperative visit for hysteroscopy dilation and curettage 2/2 postmenopausal bleeding and thickened endometrial stripe.     Plan for real time ultrasound guidance during the case Will premedicate with vaginal misoprostol for cervical ripening Risks and benefits of the procedure given including bleeding, infection, involvement of other organs as well as uterine perforation. NPO Admission labs ordered VS Q4 Preop clearance seen in care everywhere   Mariel Aloe, M.D. Attending Obstetrician & Gynecologist, [facility] for [employer], [facility]

## 2022-04-20 ENCOUNTER — Other Ambulatory Visit: Payer: Self-pay

## 2022-04-20 ENCOUNTER — Encounter (HOSPITAL_COMMUNITY): Payer: Self-pay | Admitting: Obstetrics and Gynecology

## 2022-04-20 NOTE — Pre-Procedure Instructions (Signed)
[id number] [place], [place] - [address] AT [facility] OF [address] [place] [address] Phone: [phone] Fax: [phone]  [id number] [place], [place] - [address] AT [facility] OF [address] & [address] [place] [address] Phone: [phone] Fax: [phone]   PCP - Merri Brunette, MD  EKG - DOS  ERAS Protcol - NPO  Anesthesia review: N  Patient verbally denies any shortness of breath, fever, cough and chest pain during phone call   -------------  SDW INSTRUCTIONS given:  Your procedure is scheduled on 04/21/22.  Report to [facility] Main Entrance "A" at 1:00 P.M., and check in at the Admitting office.  Call this number if you have problems the morning of surgery:  [phone]   Remember:  Do not eat or drink after midnight the night before your surgery    Take these medicines the morning of surgery with A SIP OF WATER  misoprostol (CYTOTEC)  Cyclobenzaprine HCl (FLEXERIL PO)-if needed acetaminophen (TYLENOL)-if needed  As of today, STOP taking any Aspirin (unless otherwise instructed by your surgeon) Aleve, Naproxen, Ibuprofen, Motrin, Advil, Goody's, BC's, all herbal medications, fish oil, and all vitamins.                      Do not wear jewelry, make up, or nail polish            Do not wear lotions, powders, perfumes/colognes, or deodorant.            Do not shave 48 hours prior to surgery.  Men may shave face and neck.            Do not bring valuables to the hospital.            [facility] is not responsible for any belongings or valuables.  Do NOT Smoke (Tobacco/Vaping) 24 hours prior to your procedure If you use a CPAP at night, you may bring all equipment for your overnight stay.   Contacts, glasses, dentures or bridgework may not be worn into surgery.      For patients admitted to the hospital, discharge time will be determined by your  treatment team.   Patients discharged the day of surgery will not be allowed to drive home, and someone needs to stay with them for 24 hours.    Special instructions:   [place]- Preparing For Surgery  Before surgery, you can play an important role. Because skin is not sterile, your skin needs to be as free of germs as possible. You can reduce the number of germs on your skin by washing with CHG (chlorahexidine gluconate) Soap before surgery.  CHG is an antiseptic cleaner which kills germs and bonds with the skin to continue killing germs even after washing.    Oral Hygiene is also important to reduce your risk of infection.  Remember - BRUSH YOUR TEETH THE MORNING OF SURGERY WITH YOUR REGULAR TOOTHPASTE  Please do not use if you have an allergy to CHG or antibacterial soaps. If your skin becomes reddened/irritated stop using the CHG.  Do not shave (including legs and underarms) for at least 48 hours prior to first CHG shower. It is OK to shave your face.  Please follow these instructions carefully.   Shower the NIGHT BEFORE SURGERY and the MORNING OF SURGERY with DIAL Soap.   Pat yourself dry with a CLEAN TOWEL.  Wear CLEAN  PAJAMAS to bed the night before surgery  Place CLEAN SHEETS on your bed the night of your first shower and DO NOT SLEEP WITH PETS.   Day of Surgery: Please shower morning of surgery  Wear Clean/Comfortable clothing the morning of surgery Do not apply any deodorants/lotions.   Remember to brush your teeth WITH YOUR REGULAR TOOTHPASTE.   Questions were answered. Patient verbalized understanding of instructions.

## 2022-04-21 ENCOUNTER — Other Ambulatory Visit: Payer: Self-pay

## 2022-04-21 ENCOUNTER — Ambulatory Visit (HOSPITAL_COMMUNITY)
Admission: RE | Admit: 2022-04-21 | Discharge: 2022-04-21 | Disposition: A | Discharge status: Home / Self Care | Payer: Medicare Other | Source: Ambulatory Visit | Attending: Obstetrics and Gynecology | Admitting: Obstetrics and Gynecology

## 2022-04-21 ENCOUNTER — Ambulatory Visit (HOSPITAL_COMMUNITY): Payer: Medicare Other | Admitting: Certified Registered Nurse Anesthetist

## 2022-04-21 ENCOUNTER — Encounter (HOSPITAL_COMMUNITY)
Admission: RE | Disposition: A | Discharge status: Home / Self Care | Payer: Self-pay | Source: Ambulatory Visit | Attending: Obstetrics and Gynecology

## 2022-04-21 ENCOUNTER — Ambulatory Visit (HOSPITAL_BASED_OUTPATIENT_CLINIC_OR_DEPARTMENT_OTHER): Payer: Medicare Other | Admitting: Certified Registered Nurse Anesthetist

## 2022-04-21 ENCOUNTER — Other Ambulatory Visit: Payer: Self-pay | Admitting: Obstetrics & Gynecology

## 2022-04-21 ENCOUNTER — Encounter (HOSPITAL_COMMUNITY): Payer: Self-pay | Admitting: Obstetrics and Gynecology

## 2022-04-21 DIAGNOSIS — N950 Postmenopausal bleeding: Secondary | ICD-10-CM

## 2022-04-21 DIAGNOSIS — N840 Polyp of corpus uteri: Secondary | ICD-10-CM | POA: Insufficient documentation

## 2022-04-21 DIAGNOSIS — Z6835 Body mass index (BMI) 35.0-35.9, adult: Secondary | ICD-10-CM | POA: Diagnosis not present

## 2022-04-21 DIAGNOSIS — E119 Type 2 diabetes mellitus without complications: Secondary | ICD-10-CM | POA: Diagnosis not present

## 2022-04-21 DIAGNOSIS — I119 Hypertensive heart disease without heart failure: Secondary | ICD-10-CM | POA: Insufficient documentation

## 2022-04-21 DIAGNOSIS — G8918 Other acute postprocedural pain: Secondary | ICD-10-CM

## 2022-04-21 DIAGNOSIS — R9389 Abnormal findings on diagnostic imaging of other specified body structures: Secondary | ICD-10-CM

## 2022-04-21 DIAGNOSIS — N882 Stricture and stenosis of cervix uteri: Secondary | ICD-10-CM

## 2022-04-21 HISTORY — PX: HYSTEROSCOPY WITH D & C: SHX1775

## 2022-04-21 LAB — COMPREHENSIVE METABOLIC PANEL
ALT: 15 U/L (ref 0–44)
AST: 22 U/L (ref 15–41)
Albumin: 4.2 g/dL (ref 3.5–5.0)
Alkaline Phosphatase: 94 U/L (ref 38–126)
Anion gap: 16 — ABNORMAL HIGH (ref 5–15)
BUN: 20 mg/dL (ref 8–23)
CO2: 23 mmol/L (ref 22–32)
Calcium: 9.5 mg/dL (ref 8.9–10.3)
Chloride: 100 mmol/L (ref 98–111)
Creatinine, Ser: 1.02 mg/dL — ABNORMAL HIGH (ref 0.44–1.00)
GFR, Estimated: 58 mL/min — ABNORMAL LOW (ref >60)
Glucose, Bld: 112 mg/dL — ABNORMAL HIGH (ref 70–99)
Potassium: 3 mmol/L — ABNORMAL LOW (ref 3.5–5.1)
Sodium: 139 mmol/L (ref 135–145)
Total Bilirubin: 0.8 mg/dL (ref 0.3–1.2)
Total Protein: 7.8 g/dL (ref 6.5–8.1)

## 2022-04-21 LAB — CBC
HCT: 40.4 % (ref 36.0–46.0)
Hemoglobin: 13 g/dL (ref 12.0–15.0)
MCH: 29.4 pg (ref 26.0–34.0)
MCHC: 32.2 g/dL (ref 30.0–36.0)
MCV: 91.4 fL (ref 80.0–100.0)
Platelets: 321 10*3/uL (ref 150–400)
RBC: 4.42 MIL/uL (ref 3.87–5.11)
RDW: 13.9 % (ref 11.5–15.5)
WBC: 15.4 10*3/uL — ABNORMAL HIGH (ref 4.0–10.5)
nRBC: 0 % (ref 0.0–0.2)

## 2022-04-21 SURGERY — DILATATION AND CURETTAGE /HYSTEROSCOPY
Anesthesia: General

## 2022-04-21 MED ORDER — FENTANYL CITRATE (PF) 250 MCG/5ML IJ SOLN
INTRAMUSCULAR | Status: AC
  Filled 2022-04-21: qty 5

## 2022-04-21 MED ORDER — SOD CITRATE-CITRIC ACID 500-334 MG/5ML PO SOLN
30.0000 mL | ORAL | Status: AC
  Administered 2022-04-21: 30 mL via ORAL
  Filled 2022-04-21: qty 30

## 2022-04-21 MED ORDER — ACETAMINOPHEN 500 MG PO TABS: 1000.0000 mg | ORAL_TABLET | Freq: Once | ORAL | Status: DC

## 2022-04-21 MED ORDER — CHLORHEXIDINE GLUCONATE 0.12 % MT SOLN: 15.0000 mL | Freq: Once | OROMUCOSAL | Status: AC

## 2022-04-21 MED ORDER — FENTANYL CITRATE (PF) 250 MCG/5ML IJ SOLN
INTRAMUSCULAR | Status: DC | PRN
  Administered 2022-04-21 (×3): 50 ug via INTRAVENOUS

## 2022-04-21 MED ORDER — DEXMEDETOMIDINE (PRECEDEX) IN NS 20 MCG/5ML (4 MCG/ML) IV SYRINGE
PREFILLED_SYRINGE | INTRAVENOUS | Status: DC | PRN
  Administered 2022-04-21: 10 ug via INTRAVENOUS

## 2022-04-21 MED ORDER — OXYCODONE HCL 5 MG/5ML PO SOLN: 5.0000 mg | Freq: Once | ORAL | Status: AC | PRN

## 2022-04-21 MED ORDER — FENTANYL CITRATE (PF) 100 MCG/2ML IJ SOLN
25.0000 ug | INTRAMUSCULAR | Status: DC | PRN
  Administered 2022-04-21: 50 ug via INTRAVENOUS

## 2022-04-21 MED ORDER — LIDOCAINE 2% (20 MG/ML) 5 ML SYRINGE
INTRAMUSCULAR | Status: DC | PRN
  Administered 2022-04-21: 60 mg via INTRAVENOUS

## 2022-04-21 MED ORDER — ORAL CARE MOUTH RINSE: 15.0000 mL | Freq: Once | OROMUCOSAL | Status: AC

## 2022-04-21 MED ORDER — LIDOCAINE 2% (20 MG/ML) 5 ML SYRINGE
INTRAMUSCULAR | Status: AC
  Filled 2022-04-21: qty 5

## 2022-04-21 MED ORDER — ROCURONIUM BROMIDE 10 MG/ML (PF) SYRINGE
PREFILLED_SYRINGE | INTRAVENOUS | Status: AC
  Filled 2022-04-21: qty 10

## 2022-04-21 MED ORDER — OXYCODONE HCL 5 MG PO TABS
ORAL_TABLET | ORAL | Status: AC
  Filled 2022-04-21: qty 1

## 2022-04-21 MED ORDER — PROMETHAZINE HCL 25 MG/ML IJ SOLN: 6.2500 mg | INTRAMUSCULAR | Status: DC | PRN

## 2022-04-21 MED ORDER — DEXAMETHASONE SODIUM PHOSPHATE 10 MG/ML IJ SOLN
INTRAMUSCULAR | Status: AC
  Filled 2022-04-21: qty 1

## 2022-04-21 MED ORDER — LIDOCAINE HCL (PF) 1 % IJ SOLN
INTRAMUSCULAR | Status: AC
  Filled 2022-04-21: qty 30

## 2022-04-21 MED ORDER — PROPOFOL 10 MG/ML IV BOLUS
INTRAVENOUS | Status: DC | PRN
  Administered 2022-04-21: 300 mg via INTRAVENOUS

## 2022-04-21 MED ORDER — MEPERIDINE HCL 25 MG/ML IJ SOLN: 6.2500 mg | INTRAMUSCULAR | Status: DC | PRN

## 2022-04-21 MED ORDER — OXYCODONE HCL 5 MG PO TABS: 5.0000 mg | ORAL_TABLET | Freq: Four times a day (QID) | ORAL | 0 refills | Status: DC | PRN

## 2022-04-21 MED ORDER — LACTATED RINGERS IV SOLN
INTRAVENOUS | Status: DC
Start: 2022-04-21 — End: 2022-04-21

## 2022-04-21 MED ORDER — ONDANSETRON HCL 4 MG/2ML IJ SOLN
INTRAMUSCULAR | Status: DC | PRN
  Administered 2022-04-21: 4 mg via INTRAVENOUS

## 2022-04-21 MED ORDER — ONDANSETRON HCL 4 MG/2ML IJ SOLN
INTRAMUSCULAR | Status: AC
  Filled 2022-04-21: qty 2

## 2022-04-21 MED ORDER — DEXAMETHASONE SODIUM PHOSPHATE 10 MG/ML IJ SOLN
INTRAMUSCULAR | Status: DC | PRN
  Administered 2022-04-21: 10 mg via INTRAVENOUS

## 2022-04-21 MED ORDER — CHLORHEXIDINE GLUCONATE 4 % EX LIQD: Freq: Once | CUTANEOUS | Status: DC

## 2022-04-21 MED ORDER — SODIUM CHLORIDE 0.9 % IR SOLN: Status: DC | PRN

## 2022-04-21 MED ORDER — LACTATED RINGERS IV SOLN: INTRAVENOUS | Status: DC

## 2022-04-21 MED ORDER — PROPOFOL 10 MG/ML IV BOLUS
INTRAVENOUS | Status: AC
  Filled 2022-04-21: qty 20

## 2022-04-21 MED ORDER — OXYCODONE HCL 5 MG PO TABS
5.0000 mg | ORAL_TABLET | Freq: Once | ORAL | Status: AC | PRN
  Administered 2022-04-21: 5 mg via ORAL

## 2022-04-21 MED ORDER — FENTANYL CITRATE (PF) 100 MCG/2ML IJ SOLN
INTRAMUSCULAR | Status: AC
  Filled 2022-04-21: qty 2

## 2022-04-21 MED ORDER — MIDAZOLAM HCL 2 MG/2ML IJ SOLN: 0.5000 mg | Freq: Once | INTRAMUSCULAR | Status: DC | PRN

## 2022-04-21 MED ORDER — CHLORHEXIDINE GLUCONATE 0.12 % MT SOLN
OROMUCOSAL | Status: AC
  Administered 2022-04-21: 15 mL via OROMUCOSAL
  Filled 2022-04-21: qty 15

## 2022-04-21 SURGICAL SUPPLY — 15 items
BIPOLAR CUTTING LOOP 21FR (ELECTRODE)
CATH ROBINSON RED A/P 16FR (CATHETERS) ×3 IMPLANT
CURETTE PIPELLE ENDOMTRL SUCTN (MISCELLANEOUS) IMPLANT
ELECT REM PT RETURN 9FT ADLT (ELECTROSURGICAL) ×2
ELECTRODE REM PT RTRN 9FT ADLT (ELECTROSURGICAL) ×2 IMPLANT
GLOVE SURG ORTHO 8.0 STRL STRW (GLOVE) ×3 IMPLANT
GLOVE SURG UNDER POLY LF SZ7 (GLOVE) ×6 IMPLANT
GOWN STRL REUS W/ TWL LRG LVL3 (GOWN DISPOSABLE) ×4 IMPLANT
GOWN STRL REUS W/TWL LRG LVL3 (GOWN DISPOSABLE) ×4
KIT PROCEDURE FLUENT (KITS) ×2 IMPLANT
LOOP CUTTING BIPOLAR 21FR (ELECTRODE) IMPLANT
PACK VAGINAL MINOR WOMEN LF (CUSTOM PROCEDURE TRAY) ×3 IMPLANT
PAD OB MATERNITY 4.3X12.25 (PERSONAL CARE ITEMS) ×3 IMPLANT
PIPELLE ENDOMETRIAL SUCTION CU (MISCELLANEOUS) ×2
TOWEL GREEN STERILE FF (TOWEL DISPOSABLE) ×6 IMPLANT

## 2022-04-21 NOTE — Anesthesia Procedure Notes (Signed)
Procedure Name: LMA Insertion Date/Time: 04/21/2022 4:45 PM  Performed by: Darryl Nestle, CRNAPre-anesthesia Checklist: Patient identified, Emergency Drugs available, Suction available and Patient being monitored Patient Re-evaluated:Patient Re-evaluated prior to induction Oxygen Delivery Method: Circle system utilized Preoxygenation: Pre-oxygenation with 100% oxygen Induction Type: IV induction Ventilation: Mask ventilation without difficulty LMA: LMA inserted LMA Size: 4.0 Tube type: Oral Number of attempts: 1 Placement Confirmation: positive ETCO2 and breath sounds checked- equal and bilateral Tube secured with: Tape Dental Injury: Teeth and Oropharynx as per pre-operative assessment

## 2022-04-21 NOTE — Interval H&P Note (Signed)
History and Physical Interval Note:  04/21/2022 1:54 PM  Destiny Wheeler  has presented today for surgery, with the diagnosis of PMB Cervical Stenosis.  The various methods of treatment have been discussed with the patient and family. After consideration of risks, benefits and other options for treatment, the patient has consented to  Procedure(s): DILATATION AND CURETTAGE /HYSTEROSCOPY (N/A) as a surgical intervention.  The patient's history has been reviewed, patient examined, no change in status, stable for surgery.  I have reviewed the patient's chart and labs.  Questions were answered to the patient's satisfaction.     Warden Fillers

## 2022-04-21 NOTE — Transfer of Care (Signed)
Immediate Anesthesia Transfer of Care Note  Patient: Destiny Wheeler  Procedure(s) Performed: DILATATION AND CURETTAGE  Patient Location: PACU  Anesthesia Type:General  Level of Consciousness: awake, alert  and oriented  Airway & Oxygen Therapy: Patient Spontanous Breathing  Post-op Assessment: Report given to RN and Post -op Vital signs reviewed and stable  Post vital signs: Reviewed and stable  Last Vitals:  Vitals Value Taken Time  BP    Temp    Pulse    Resp    SpO2      Last Pain:  Vitals:   04/21/22 1344  TempSrc:   PainSc: 0-No pain         Complications: No notable events documented.

## 2022-04-21 NOTE — Anesthesia Preprocedure Evaluation (Addendum)
Anesthesia Evaluation  Patient identified by MRN, date of birth, ID band Patient awake    Reviewed: Allergy & Precautions, NPO status , Patient's Chart, lab work & pertinent test results  History of Anesthesia Complications Negative for: history of anesthetic complications  Airway Mallampati: II  TM Distance: >3 FB Neck ROM: Full    Dental  (+) Poor Dentition, Dental Advisory Given, Missing   Pulmonary neg pulmonary ROS,    breath sounds clear to auscultation       Cardiovascular hypertension, Pt. on medications (-) angina Rhythm:Regular Rate:Normal     Neuro/Psych negative neurological ROS     GI/Hepatic negative GI ROS, Neg liver ROS,   Endo/Other  diabetes (diet controlled, glu 112)Morbid obesity  Renal/GU negative Renal ROS     Musculoskeletal   Abdominal (+) + obese,   Peds  Hematology   Anesthesia Other Findings   Reproductive/Obstetrics                            Anesthesia Physical Anesthesia Plan  ASA: 2  Anesthesia Plan: General   Post-op Pain Management: Tylenol PO (pre-op)*   Induction: Intravenous  PONV Risk Score and Plan: 3 and Ondansetron, Dexamethasone and Treatment may vary due to age or medical condition  Airway Management Planned: LMA  Additional Equipment: None  Intra-op Plan:   Post-operative Plan:   Informed Consent: I have reviewed the patients History and Physical, chart, labs and discussed the procedure including the risks, benefits and alternatives for the proposed anesthesia with the patient or authorized representative who has indicated his/her understanding and acceptance.     Dental advisory given  Plan Discussed with: CRNA and Surgeon  Anesthesia Plan Comments:        Anesthesia Quick Evaluation

## 2022-04-21 NOTE — Op Note (Signed)
PREOPERATIVE DIAGNOSIS: thickened endometrial stripe, postmenopausal bleeding POSTOPERATIVE DIAGNOSIS: The same PROCEDURE: Hysteroscopy, Dilation and Curettage with ultrasound guidance.  Endometrial biopsy SURGEON:  Dr. Mariel Aloe   INDICATIONS: 71 y.o. [id number]  here for scheduled surgery for the aforementioned diagnoses.   Risks of surgery were discussed with the patient including but not limited to: bleeding which may require transfusion; infection which may require antibiotics; injury to uterus or surrounding organs; intrauterine scarring which may impair future fertility; need for additional procedures including laparotomy or laparoscopy; and other postoperative/anesthesia complications. Written informed consent was obtained.    FINDINGS:  A 7 week size uterus.  Scant endometrium.  ANESTHESIA:   General with LMA INTRAVENOUS FLUIDS:  600 ml of LR FLUID DEFICITS:  5 ml of NS ESTIMATED BLOOD LOSS:  Less than 20 ml SPECIMENS: Endometrial curettings sent to pathology, endometrial biopsy COMPLICATIONS:  None immediate.  PROCEDURE DETAILS:  The patient was then taken to the operating room where general anesthesia was administered and was found to be adequate.  After an adequate timeout was performed, she was placed in the dorsal lithotomy position and examined; then prepped and draped in the sterile manner.   A speculum was then placed in the patient's vagina and a single tooth tenaculum was applied to the anterior lip of the cervix.   Real time ultrasound was performed to guide the dilation process. The uterus was sounded to 7 cm and the cervix was dilated manually with metal dilators to accommodate the 5 mm diagnostic hysteroscope.  The hysteroscope was then inserted under direct visualization using NS as a suspension medium.  There was a technical issue with the hysteroscope with poor visualization.  Troubleshooting was ineffective so the hysteroscope was removed under direct visualization.  A  sharp curettage was then performed to obtain a moderate amount of endometrial curettings. To ensure an adequate sample an endometrial biopsy was performed with 3-4 passes. The tenaculum was removed from the anterior lip of the cervix and the vaginal speculum was removed after noting good hemostasis.  The patient tolerated the procedure well and was taken to the recovery area awake, extubated and in stable condition.   The patient will be discharged to home as per PACU criteria.  Routine postoperative instructions given.  She was prescribed Percocet, Ibuprofen and Colace.  She will follow up in the clinic in  2-3 weeks  for postoperative evaluation.   Mariel Aloe, MD, FACOG Obstetrician & Gynecologist, [facility] for [facility], [facility]

## 2022-04-21 NOTE — Progress Notes (Signed)
Patient called PACU, unable to get Oxycodone prescribed by Dr. Donavan Foil at [facility] as they did not have any in stock. This was confirmed by PACU RN.  [facility] [address], this was done.   Jaynie Collins, MD, FACOG Obstetrician & Gynecologist, [facility] for [employer], [facility]

## 2022-04-22 ENCOUNTER — Encounter (HOSPITAL_COMMUNITY): Payer: Self-pay | Admitting: Obstetrics and Gynecology

## 2022-04-22 NOTE — Anesthesia Postprocedure Evaluation (Signed)
Anesthesia Post Note  Patient: Destiny Wheeler  Procedure(s) Performed: DILATATION AND CURETTAGE     Patient location during evaluation: PACU Anesthesia Type: General Level of consciousness: awake Pain management: pain level controlled Vital Signs Assessment: post-procedure vital signs reviewed and stable Respiratory status: spontaneous breathing, nonlabored ventilation, respiratory function stable and patient connected to nasal cannula oxygen Cardiovascular status: blood pressure returned to baseline and stable Postop Assessment: no apparent nausea or vomiting Anesthetic complications: no   No notable events documented.  Last Vitals:  Vitals:   04/21/22 1815 04/21/22 1828  BP: (!) 113/58 (!) 103/54  Pulse: 66 61  Resp: 18 17  Temp:  36.4 C  SpO2: 92% 95%    Last Pain:  Vitals:   04/21/22 1815  TempSrc:   PainSc: Asleep                 Darrelyn Morro P Ethleen Lormand

## 2022-04-23 ENCOUNTER — Telehealth: Payer: Self-pay

## 2022-04-23 LAB — SURGICAL PATHOLOGY

## 2022-04-23 NOTE — Telephone Encounter (Addendum)
-----   Message from Warden Fillers, MD sent at 04/23/2022  2:04 PM EDT ----- Findings from procedure were consistent with endometrial polyp.  There was no cancer or precancer.  Hopefully the scraping removed it in its entirety and the bleeding will not return once you have healed.  Pt notified of provider's recommendations and results.  Pt verbalized understanding.   Destiny Wheeler 04/23/22

## 2022-04-25 LAB — TYPE AND SCREEN
ABO/RH(D): B NEG
Antibody Screen: POSITIVE
Unit division: 0
Unit division: 0

## 2022-04-25 LAB — BPAM RBC
Blood Product Expiration Date: 202308132359
Blood Product Expiration Date: 202308142359
Unit Type and Rh: 1700
Unit Type and Rh: 1700

## 2022-04-27 ENCOUNTER — Telehealth: Payer: Self-pay | Admitting: Family Medicine

## 2022-04-27 NOTE — Telephone Encounter (Signed)
Need a prescription (oxycodone) refilled.

## 2022-04-28 ENCOUNTER — Telehealth: Payer: Self-pay | Admitting: Obstetrics and Gynecology

## 2022-04-28 NOTE — Telephone Encounter (Signed)
Patient requesting a RX Refill for pain meds and she also have a few other questions, state she have not been feeling well.

## 2022-04-28 NOTE — Telephone Encounter (Signed)
Called and spoke. She is requesting more Oxycodone. She reports she is having cramps on and off, mostly at night and sometimes during the day. She has not tried Ibuprofen. She has tried Tylenol every 4 hours (Extra Strength 2 capsules) and not helping.   Recommended taking Ibuprofen, she reports that she cannot take due to High Blood pressure. Reviewed trying 400 mg every 4-6 hours for cramping with food in the stomach.   Patient asking what she can take for Diarrhea and vomiting that started this morning and has been on and off all day. She has been drinking water and eating bananas today and it has slowed down. Reviewed can try Imodium if needed.   Will reach out to Dr. Donavan Foil for advisement, will contact patient back when hear back from him.

## 2022-04-30 ENCOUNTER — Other Ambulatory Visit (INDEPENDENT_AMBULATORY_CARE_PROVIDER_SITE_OTHER): Payer: Medicare Other | Admitting: Obstetrics and Gynecology

## 2022-04-30 DIAGNOSIS — G8918 Other acute postprocedural pain: Secondary | ICD-10-CM

## 2022-04-30 MED ORDER — OXYCODONE HCL 5 MG PO TABS: 5.0000 mg | ORAL_TABLET | ORAL | 0 refills | Status: DC | PRN

## 2022-04-30 NOTE — Telephone Encounter (Signed)
Called and spoke with patient. She reports she is still in pain. Informed her that Oxycodone has been called in to her Pharmacy. She reports she did not take Ibuprofen as advised. She reports her vomiting and diarrhea is better. Reviewed she can use Imodium if needed.

## 2022-04-30 NOTE — Progress Notes (Signed)
New rx for 10 5 mg oxycodone sent.  No further refills at this time

## 2022-05-08 ENCOUNTER — Other Ambulatory Visit: Payer: Self-pay

## 2022-05-08 ENCOUNTER — Ambulatory Visit (INDEPENDENT_AMBULATORY_CARE_PROVIDER_SITE_OTHER): Payer: Medicare Other | Admitting: Obstetrics and Gynecology

## 2022-05-08 ENCOUNTER — Encounter: Payer: Self-pay | Admitting: Obstetrics and Gynecology

## 2022-05-08 VITALS — BP 135/74 | HR 103 | Ht 68.0 in | Wt 228.1 lb

## 2022-05-08 DIAGNOSIS — Z4889 Encounter for other specified surgical aftercare: Secondary | ICD-10-CM | POA: Diagnosis not present

## 2022-05-08 NOTE — Progress Notes (Signed)
Pt reports slight pain in abdomen that comes & goes.

## 2022-05-08 NOTE — Progress Notes (Signed)
    Subjective:    Destiny Wheeler is a 71 y.o. female who presents to the clinic status post hysteroscopy dilation and curettage on 04/21/22. Pain is controlled without any medications.  Eating a regular diet without difficulty. Bowel movements are normal. No other significant postoperative concerns.  The following portions of the patient's history were reviewed and updated as appropriate: allergies, current medications, past family history, past medical history, past social history, past surgical history, and problem list.. Review of Systems Pertinent items are noted in HPI.   Objective:   BP 135/74   Pulse (!) 103   Ht 5\' 8"  (1.727 m)   Wt 228 lb 1.6 oz (103.5 kg)   BMI 34.68 kg/m  Constitutional:  Well-developed, well-nourished female in no acute distress.   Skin: Skin is warm and dry, no rash noted, not diaphoretic,no erythema, no pallor.  Cardiovascular: Normal heart rate noted  Respiratory: Effort and breath sounds normal, no problems with respiration noted  Abdomen: Soft, bowel sounds active, non-tender, no abnormal masses  Incision: Healing well, no drainage, no erythema, no hernia, no seroma, no swelling, no dehiscence, incision well approximated  Pelvic:    No blood in the vagina, no CMT or uterine tenderness   Surgical pathology () Curettigns and endometrial biopsy c/w endometrial polyp Assessment:   Doing well postoperatively.  Operative findings again reviewed. Pathology report discussed.   Plan:   1. Continue any current medications. 2. Wound care discussed. 3. Activity restrictions: none 4. Anticipated return to work: not applicable. 5. Follow up as needed 6.  Routine preventative health maintenance measures emphasized. Please refer to After Visit Summary for other counseling recommendations.    , MD, FACOG Attending Obstetrician & Gynecologist Center for [facility], [facility]

## 2022-05-11 ENCOUNTER — Telehealth: Payer: Self-pay | Admitting: Family Medicine

## 2022-05-11 ENCOUNTER — Encounter: Payer: Self-pay | Admitting: Obstetrics and Gynecology

## 2022-05-11 NOTE — Telephone Encounter (Signed)
Call placed back to pt. Spoke with pt. Pt states having UTI symptoms for 3 days since Saturday. Has used AZO and Tylenol with no relief. Pt states has PCP appt tomorrow. Advised to tell them of symptoms or can call us back and make another appt for urine check. Pt verbalized understanding and will ask at PCP appt on 8/1. Pt was seen last on 7/28 for post op care with Dr Donavan Foil. Have sent Dr Donavan Foil message for advice.  Corinthia Helmers,RNC

## 2022-05-11 NOTE — Telephone Encounter (Signed)
Patient believes she has a UTI. Would like a nurse to call her back.

## 2022-05-12 DIAGNOSIS — I10 Essential (primary) hypertension: Secondary | ICD-10-CM | POA: Diagnosis not present

## 2022-05-12 DIAGNOSIS — N939 Abnormal uterine and vaginal bleeding, unspecified: Secondary | ICD-10-CM | POA: Diagnosis not present

## 2022-05-12 DIAGNOSIS — Z1211 Encounter for screening for malignant neoplasm of colon: Secondary | ICD-10-CM | POA: Diagnosis not present

## 2022-05-12 DIAGNOSIS — E118 Type 2 diabetes mellitus with unspecified complications: Secondary | ICD-10-CM | POA: Diagnosis not present

## 2022-05-12 DIAGNOSIS — R300 Dysuria: Secondary | ICD-10-CM | POA: Diagnosis not present

## 2022-05-15 ENCOUNTER — Telehealth: Payer: Self-pay | Admitting: Obstetrics and Gynecology

## 2022-05-15 NOTE — Telephone Encounter (Signed)
Offered appt Monday AM or patient may go to urgent care over the weekend. Pt states she will go to urgent care because this has been happening since Monday.

## 2022-05-15 NOTE — Telephone Encounter (Signed)
Requesting a RX fro a UTI

## 2022-06-19 DIAGNOSIS — Z1211 Encounter for screening for malignant neoplasm of colon: Secondary | ICD-10-CM | POA: Diagnosis not present

## 2022-06-28 DIAGNOSIS — K635 Polyp of colon: Secondary | ICD-10-CM | POA: Diagnosis not present

## 2022-07-21 DIAGNOSIS — Z86010 Personal history of colonic polyps: Secondary | ICD-10-CM | POA: Diagnosis not present

## 2022-07-21 DIAGNOSIS — K5790 Diverticulosis of intestine, part unspecified, without perforation or abscess without bleeding: Secondary | ICD-10-CM | POA: Diagnosis not present

## 2022-10-10 DIAGNOSIS — H40033 Anatomical narrow angle, bilateral: Secondary | ICD-10-CM | POA: Diagnosis not present

## 2022-10-10 DIAGNOSIS — H2513 Age-related nuclear cataract, bilateral: Secondary | ICD-10-CM | POA: Diagnosis not present

## 2022-11-26 DIAGNOSIS — M5459 Other low back pain: Secondary | ICD-10-CM | POA: Diagnosis not present

## 2022-11-26 DIAGNOSIS — R531 Weakness: Secondary | ICD-10-CM | POA: Diagnosis not present

## 2022-11-26 DIAGNOSIS — M25569 Pain in unspecified knee: Secondary | ICD-10-CM | POA: Diagnosis not present

## 2022-11-26 DIAGNOSIS — R269 Unspecified abnormalities of gait and mobility: Secondary | ICD-10-CM | POA: Diagnosis not present

## 2022-12-02 DIAGNOSIS — R531 Weakness: Secondary | ICD-10-CM | POA: Diagnosis not present

## 2022-12-02 DIAGNOSIS — R269 Unspecified abnormalities of gait and mobility: Secondary | ICD-10-CM | POA: Diagnosis not present

## 2022-12-02 DIAGNOSIS — M25569 Pain in unspecified knee: Secondary | ICD-10-CM | POA: Diagnosis not present

## 2022-12-02 DIAGNOSIS — M5459 Other low back pain: Secondary | ICD-10-CM | POA: Diagnosis not present

## 2022-12-03 DIAGNOSIS — R531 Weakness: Secondary | ICD-10-CM | POA: Diagnosis not present

## 2022-12-03 DIAGNOSIS — M25569 Pain in unspecified knee: Secondary | ICD-10-CM | POA: Diagnosis not present

## 2022-12-03 DIAGNOSIS — R269 Unspecified abnormalities of gait and mobility: Secondary | ICD-10-CM | POA: Diagnosis not present

## 2022-12-03 DIAGNOSIS — M5459 Other low back pain: Secondary | ICD-10-CM | POA: Diagnosis not present

## 2022-12-04 DIAGNOSIS — R531 Weakness: Secondary | ICD-10-CM | POA: Diagnosis not present

## 2022-12-04 DIAGNOSIS — R269 Unspecified abnormalities of gait and mobility: Secondary | ICD-10-CM | POA: Diagnosis not present

## 2022-12-04 DIAGNOSIS — M25569 Pain in unspecified knee: Secondary | ICD-10-CM | POA: Diagnosis not present

## 2022-12-04 DIAGNOSIS — M5459 Other low back pain: Secondary | ICD-10-CM | POA: Diagnosis not present

## 2022-12-09 DIAGNOSIS — R531 Weakness: Secondary | ICD-10-CM | POA: Diagnosis not present

## 2022-12-09 DIAGNOSIS — M5459 Other low back pain: Secondary | ICD-10-CM | POA: Diagnosis not present

## 2022-12-09 DIAGNOSIS — M25569 Pain in unspecified knee: Secondary | ICD-10-CM | POA: Diagnosis not present

## 2022-12-09 DIAGNOSIS — R269 Unspecified abnormalities of gait and mobility: Secondary | ICD-10-CM | POA: Diagnosis not present

## 2022-12-17 DIAGNOSIS — R531 Weakness: Secondary | ICD-10-CM | POA: Diagnosis not present

## 2022-12-17 DIAGNOSIS — M5459 Other low back pain: Secondary | ICD-10-CM | POA: Diagnosis not present

## 2022-12-17 DIAGNOSIS — R269 Unspecified abnormalities of gait and mobility: Secondary | ICD-10-CM | POA: Diagnosis not present

## 2022-12-17 DIAGNOSIS — M25569 Pain in unspecified knee: Secondary | ICD-10-CM | POA: Diagnosis not present

## 2022-12-18 DIAGNOSIS — R531 Weakness: Secondary | ICD-10-CM | POA: Diagnosis not present

## 2022-12-18 DIAGNOSIS — R269 Unspecified abnormalities of gait and mobility: Secondary | ICD-10-CM | POA: Diagnosis not present

## 2022-12-18 DIAGNOSIS — M5459 Other low back pain: Secondary | ICD-10-CM | POA: Diagnosis not present

## 2022-12-18 DIAGNOSIS — M25569 Pain in unspecified knee: Secondary | ICD-10-CM | POA: Diagnosis not present

## 2022-12-23 DIAGNOSIS — R269 Unspecified abnormalities of gait and mobility: Secondary | ICD-10-CM | POA: Diagnosis not present

## 2022-12-23 DIAGNOSIS — M25569 Pain in unspecified knee: Secondary | ICD-10-CM | POA: Diagnosis not present

## 2022-12-23 DIAGNOSIS — M5459 Other low back pain: Secondary | ICD-10-CM | POA: Diagnosis not present

## 2022-12-23 DIAGNOSIS — R531 Weakness: Secondary | ICD-10-CM | POA: Diagnosis not present

## 2022-12-24 DIAGNOSIS — M25569 Pain in unspecified knee: Secondary | ICD-10-CM | POA: Diagnosis not present

## 2022-12-24 DIAGNOSIS — M5459 Other low back pain: Secondary | ICD-10-CM | POA: Diagnosis not present

## 2022-12-24 DIAGNOSIS — R269 Unspecified abnormalities of gait and mobility: Secondary | ICD-10-CM | POA: Diagnosis not present

## 2022-12-24 DIAGNOSIS — R531 Weakness: Secondary | ICD-10-CM | POA: Diagnosis not present

## 2022-12-25 DIAGNOSIS — R269 Unspecified abnormalities of gait and mobility: Secondary | ICD-10-CM | POA: Diagnosis not present

## 2022-12-25 DIAGNOSIS — M25569 Pain in unspecified knee: Secondary | ICD-10-CM | POA: Diagnosis not present

## 2022-12-25 DIAGNOSIS — M5459 Other low back pain: Secondary | ICD-10-CM | POA: Diagnosis not present

## 2022-12-25 DIAGNOSIS — R531 Weakness: Secondary | ICD-10-CM | POA: Diagnosis not present

## 2022-12-30 DIAGNOSIS — R269 Unspecified abnormalities of gait and mobility: Secondary | ICD-10-CM | POA: Diagnosis not present

## 2022-12-30 DIAGNOSIS — M5459 Other low back pain: Secondary | ICD-10-CM | POA: Diagnosis not present

## 2022-12-30 DIAGNOSIS — M25569 Pain in unspecified knee: Secondary | ICD-10-CM | POA: Diagnosis not present

## 2022-12-30 DIAGNOSIS — R531 Weakness: Secondary | ICD-10-CM | POA: Diagnosis not present

## 2023-01-05 DIAGNOSIS — R269 Unspecified abnormalities of gait and mobility: Secondary | ICD-10-CM | POA: Diagnosis not present

## 2023-01-05 DIAGNOSIS — R531 Weakness: Secondary | ICD-10-CM | POA: Diagnosis not present

## 2023-01-05 DIAGNOSIS — M25569 Pain in unspecified knee: Secondary | ICD-10-CM | POA: Diagnosis not present

## 2023-01-05 DIAGNOSIS — M5459 Other low back pain: Secondary | ICD-10-CM | POA: Diagnosis not present

## 2023-01-06 DIAGNOSIS — M25569 Pain in unspecified knee: Secondary | ICD-10-CM | POA: Diagnosis not present

## 2023-01-06 DIAGNOSIS — R269 Unspecified abnormalities of gait and mobility: Secondary | ICD-10-CM | POA: Diagnosis not present

## 2023-01-06 DIAGNOSIS — R531 Weakness: Secondary | ICD-10-CM | POA: Diagnosis not present

## 2023-01-06 DIAGNOSIS — M5459 Other low back pain: Secondary | ICD-10-CM | POA: Diagnosis not present

## 2023-01-07 DIAGNOSIS — I10 Essential (primary) hypertension: Secondary | ICD-10-CM | POA: Diagnosis not present

## 2023-01-07 DIAGNOSIS — E119 Type 2 diabetes mellitus without complications: Secondary | ICD-10-CM | POA: Diagnosis not present

## 2023-01-08 DIAGNOSIS — R269 Unspecified abnormalities of gait and mobility: Secondary | ICD-10-CM | POA: Diagnosis not present

## 2023-01-08 DIAGNOSIS — R531 Weakness: Secondary | ICD-10-CM | POA: Diagnosis not present

## 2023-01-08 DIAGNOSIS — M5459 Other low back pain: Secondary | ICD-10-CM | POA: Diagnosis not present

## 2023-01-08 DIAGNOSIS — M25569 Pain in unspecified knee: Secondary | ICD-10-CM | POA: Diagnosis not present

## 2023-01-13 DIAGNOSIS — Z0000 Encounter for general adult medical examination without abnormal findings: Secondary | ICD-10-CM | POA: Diagnosis not present

## 2023-07-16 DIAGNOSIS — K080 Exfoliation of teeth due to systemic causes: Secondary | ICD-10-CM | POA: Diagnosis not present

## 2023-10-30 ENCOUNTER — Ambulatory Visit (INDEPENDENT_AMBULATORY_CARE_PROVIDER_SITE_OTHER): Payer: Medicare Other

## 2023-10-30 ENCOUNTER — Ambulatory Visit (HOSPITAL_COMMUNITY)
Admission: EM | Admit: 2023-10-30 | Discharge: 2023-10-30 | Disposition: A | Discharge status: Home / Self Care | Payer: Medicare Other | Attending: Emergency Medicine | Admitting: Emergency Medicine

## 2023-10-30 DIAGNOSIS — J069 Acute upper respiratory infection, unspecified: Secondary | ICD-10-CM

## 2023-10-30 DIAGNOSIS — R509 Fever, unspecified: Secondary | ICD-10-CM | POA: Diagnosis not present

## 2023-10-30 DIAGNOSIS — I700 Atherosclerosis of aorta: Secondary | ICD-10-CM | POA: Diagnosis not present

## 2023-10-30 DIAGNOSIS — U071 COVID-19: Secondary | ICD-10-CM

## 2023-10-30 DIAGNOSIS — R052 Subacute cough: Secondary | ICD-10-CM | POA: Diagnosis not present

## 2023-10-30 DIAGNOSIS — R059 Cough, unspecified: Secondary | ICD-10-CM | POA: Diagnosis not present

## 2023-10-30 LAB — POC COVID19/FLU A&B COMBO
Covid Antigen, POC: POSITIVE — AB
Influenza A Antigen, POC: NEGATIVE
Influenza B Antigen, POC: NEGATIVE

## 2023-10-30 MED ORDER — ACETAMINOPHEN 500 MG PO TABS: 1000.0000 mg | ORAL_TABLET | Freq: Three times a day (TID) | ORAL | 1 refills | Status: AC | PRN

## 2023-10-30 MED ORDER — GUAIFENESIN ER 600 MG PO TB12: 600.0000 mg | ORAL_TABLET | Freq: Two times a day (BID) | ORAL | 0 refills | Status: AC | PRN

## 2023-10-30 MED ORDER — IBUPROFEN 600 MG PO TABS: 600.0000 mg | ORAL_TABLET | Freq: Three times a day (TID) | ORAL | 0 refills | Status: AC | PRN

## 2023-10-30 MED ORDER — AZELASTINE HCL 0.1 % NA SOLN: 1.0000 | Freq: Two times a day (BID) | NASAL | 2 refills | Status: DC

## 2023-10-30 MED ORDER — AZELASTINE HCL 0.1 % NA SOLN: 1.0000 | Freq: Two times a day (BID) | NASAL | 2 refills | Status: AC

## 2023-10-30 NOTE — Discharge Instructions (Addendum)
You are COVID positive Take medications as ordered. Medications may be purchased OTC if insurance does not cover them. Salt water gargles Motrin 600mg  or Tylenol 1000mg  three times a day for generalized body aches and fever Ensure hydration Consider adding on Zinc and Vit C for immunity

## 2023-10-30 NOTE — ED Triage Notes (Signed)
Cough, sore throat and bilateral ear pain. Started yesterday. Patient is taking Mucin ex and drinking OJ.

## 2023-10-30 NOTE — ED Provider Notes (Signed)
[facility]    CSN: [id number] Arrival date & time: 10/30/23  1235      History   Chief Complaint Chief Complaint  Patient presents with   Cough   Otalgia   Sore Throat    HPI Destiny GALELLA is a 73 y.o. female.   Cough, sore throat and bilateral ear pain, chills, and body aches.  Started yesterday. Patient is taking Mucinex and drinking OJ.  She is exposed to a Grandchild who is sick.      The history is provided by the patient.  Cough Associated symptoms: ear pain, fever, rhinorrhea and sore throat   Otalgia Associated symptoms: congestion, cough, fever, rhinorrhea and sore throat   Sore Throat    Past Medical History:  Diagnosis Date   Hypertension     Patient Active Problem List   Diagnosis Date Noted   Type 2 diabetes mellitus without complications (HCC) 04/16/2022   Preoperative exam for gynecologic surgery 04/16/2022   Cervical stenosis (uterine cervix) 01/20/2022   Obesity 01/19/2022   Essential hypertension 01/19/2022   History of colitis 01/19/2022   Low back pain 01/19/2022   Vaginal bleeding 01/19/2022   Abnormal vaginal bleeding with endometrial thickness greater than 5 mm present on transvaginal ultrasound in postmenopausal patient 01/19/2022   Acute colitis 02/05/2018   Rectal bleeding 02/05/2018   Accelerated hypertension 02/05/2018   Hypokalemia 02/05/2018   OSTEOARTHROSIS UNSPEC WHETHER GEN/LOCALIZED HAND 10/19/2007   DEQUERVAIN'S 10/19/2007    Past Surgical History:  Procedure Laterality Date   DILATION AND CURETTAGE OF UTERUS     HYSTEROSCOPY WITH D & C N/A 04/21/2022   Procedure: DILATATION AND CURETTAGE;  Surgeon: Warden Fillers, MD;  Location: [facility];  Service: Gynecology;  Laterality: N/A;    OB History     Gravida  5   Para  5   Term  5   Preterm  0   AB  0   Living  5      SAB  0   IAB  0   Ectopic  0   Multiple  0   Live Births  5            Home Medications    Prior to  Admission medications   Medication Sig Start Date End Date Taking? Authorizing Provider  Cholecalciferol (VITAMIN D) 50 MCG (2000 UT) tablet Take 2,000 Units by mouth daily.   Yes [provider]  Cyclobenzaprine HCl (FLEXERIL PO) Take 10 mg by mouth every 8 (eight) hours as needed (muscle spasms).   Yes [provider]  guaiFENesin (MUCINEX) 600 MG 12 hr tablet Take 1 tablet (600 mg total) by mouth 2 (two) times daily as needed. 10/30/23  Yes [place] Sloop, Linde Gillis, NP  ibuprofen (ADVIL) 600 MG tablet Take 1 tablet (600 mg total) by mouth every 8 (eight) hours as needed. 10/30/23  Yes Jadia Capers, Linde Gillis, NP  losartan (COZAAR) 50 MG tablet Take 50 mg by mouth daily. 01/07/22  Yes [provider]  Multiple Vitamins-Minerals (CENTRAVITES 50 PLUS PO) Take 1 tablet by mouth daily.   Yes [provider]  triamterene-hydrochlorothiazide (MAXZIDE-25) 37.5-25 MG tablet Take 1 tablet by mouth daily. 01/07/22  Yes [provider]  acetaminophen (TYLENOL) 500 MG tablet Take 2 tablets (1,000 mg total) by mouth every 8 (eight) hours as needed for fever. 10/30/23   Olyver Hawes, Linde Gillis, NP  azelastine (ASTELIN) 0.1 % nasal spray Place 1 spray into both nostrils 2 (two)  times daily. Use in each nostril as directed 10/30/23   Aven Christen, Linde Gillis, NP  omeprazole (PRILOSEC) 20 MG capsule Take 1 capsule (20 mg total) by mouth daily. Patient not taking: Reported on 03/16/2019 08/12/16 03/28/19  Shaune Pollack, MD    Family History No family history on file.  Social History Social History   Tobacco Use   Smoking status: Never   Smokeless tobacco: Never  Vaping Use   Vaping status: Never Used  Substance Use Topics   Alcohol use: No   Drug use: No     Allergies   Iodine   Review of Systems Review of Systems  Constitutional:  Positive for fatigue and fever.  HENT:  Positive for congestion, ear pain, rhinorrhea and sore throat.   Respiratory:  Positive for cough.   Genitourinary:  Negative.   Musculoskeletal:  Positive for arthralgias.  Skin: Negative.      Physical Exam Triage Vital Signs ED Triage Vitals [10/30/23 1351]  Encounter Vitals Group     BP (!) 181/83     Systolic BP Percentile      Diastolic BP Percentile      Pulse Rate (!) 109     Resp 16     Temp 98.7 F (37.1 C)     Temp Source Oral     SpO2 94 %     Weight      Height      Head Circumference      Peak Flow      Pain Score      Pain Loc      Pain Education      Exclude from Growth Chart    No data found.  Updated Vital Signs BP (!) 181/83 (BP Location: Left Arm)   Pulse (!) 109   Temp 98.7 F (37.1 C) (Oral)   Resp 16   SpO2 94%   Visual Acuity Right Eye Distance:   Left Eye Distance:   Bilateral Distance:    Right Eye Near:   Left Eye Near:    Bilateral Near:     Physical Exam Constitutional:      Appearance: She is ill-appearing.  HENT:     Right Ear: Tympanic membrane is erythematous.     Nose: Congestion and rhinorrhea present. Rhinorrhea is clear.     Right Turbinates: Swollen.     Left Turbinates: Swollen.     Right Sinus: Maxillary sinus tenderness present.     Mouth/Throat:     Pharynx: Posterior oropharyngeal erythema, uvula swelling and postnasal drip present.  Pulmonary:     Effort: Pulmonary effort is normal.     Breath sounds: Decreased air movement present. Examination of the right-lower field reveals decreased breath sounds. Examination of the left-lower field reveals decreased breath sounds. Decreased breath sounds present.     Comments: Decreased expiratory air movement.   Lymphadenopathy:     Cervical: Cervical adenopathy present.     Right cervical: Superficial cervical adenopathy present.     Left cervical: Superficial cervical adenopathy present.  Neurological:     Mental Status: She is alert.      UC Treatments / Results  Labs (all labs ordered are listed, but only abnormal results are displayed) Labs Reviewed  POC COVID19/FLU  A&B COMBO - Abnormal; Notable for the following components:      Result Value   Covid Antigen, POC Positive (*)    All other components within normal limits    EKG   Radiology  DG Chest 2 View Result Date: 10/30/2023 CLINICAL DATA:  Fever and cough. EXAM: CHEST - 2 VIEW COMPARISON:  03/16/2019 FINDINGS: The heart size and mediastinal contours are within normal limits. Aortic atherosclerotic calcifications. Both lungs are clear. Spondylosis in the thoracic spine. IMPRESSION: No active cardiopulmonary disease. Electronically Signed   By: Signa Kell M.D.   On: 10/30/2023 14:50    Procedures Procedures (including critical care time)  Medications Ordered in UC Medications - No data to display  Initial Impression / Assessment and Plan / UC Course  I have reviewed the triage vital signs and the nursing notes.  Pertinent labs & imaging results that were available during my care of the patient were reviewed by me and considered in my medical decision making (see chart for details).   Patient is presenting with symptoms of upper viral respiratory infection.  She does have diminished lung sounds bilateral lower lobes with diminished expiratory.  She has been exposed to a sick grandchild.  We will test for COVID and flu.  Due to diminished lung sounds and fever we will run a chest x-ray.  Chest x-ray reviewed and is negative.  COVID testing is positive.  We have discussed Paxlovid patient has not had a CMP BMP renal function panel greater than 1 year previous GFR was 58.  Patient declines blood work for kidney function at this time therefore we will hold off on Paxlovid patient is agreeable with plan. Red flag symptoms are reviewed with patient and she is agreeable with following up with PCP for any worsening of symptoms.    Final Clinical Impressions(s) / UC Diagnoses   Final diagnoses:  Viral upper respiratory tract infection  Subacute cough  COVID-19     Discharge Instructions       You are COVID positive Take medications as ordered. Medications may be purchased OTC if insurance does not cover them. Salt water gargles Motrin 600mg  or Tylenol 1000mg  three times a day for generalized body aches and fever Ensure hydration Consider adding on Zinc and Vit C for immunity       ED Prescriptions     Medication Sig Dispense Auth. Provider   acetaminophen (TYLENOL) 500 MG tablet Take 2 tablets (1,000 mg total) by mouth every 8 (eight) hours as needed for fever. 60 tablet Zachery Niswander, Linde Gillis, NP   ibuprofen (ADVIL) 600 MG tablet Take 1 tablet (600 mg total) by mouth every 8 (eight) hours as needed. 30 tablet Jazilyn Siegenthaler, Linde Gillis, NP   guaiFENesin (MUCINEX) 600 MG 12 hr tablet Take 1 tablet (600 mg total) by mouth 2 (two) times daily as needed. 60 tablet Nobuko Gsell M, NP   azelastine (ASTELIN) 0.1 % nasal spray  (Status: Discontinued) Place 1 spray into both nostrils 2 (two) times daily. Use in each nostril as directed 30 mL Parag Dorton M, NP   azelastine (ASTELIN) 0.1 % nasal spray Place 1 spray into both nostrils 2 (two) times daily. Use in each nostril as directed 30 mL Chamari Cutbirth, Linde Gillis, NP      PDMP not reviewed this encounter.   Nelda Marseille, NP 10/30/23 [phone]

## 2024-05-08 ENCOUNTER — Emergency Department (HOSPITAL_BASED_OUTPATIENT_CLINIC_OR_DEPARTMENT_OTHER): Admitting: Radiology

## 2024-05-08 ENCOUNTER — Other Ambulatory Visit: Payer: Self-pay

## 2024-05-08 ENCOUNTER — Emergency Department (HOSPITAL_BASED_OUTPATIENT_CLINIC_OR_DEPARTMENT_OTHER)

## 2024-05-08 ENCOUNTER — Emergency Department (HOSPITAL_BASED_OUTPATIENT_CLINIC_OR_DEPARTMENT_OTHER)
Admission: EM | Admit: 2024-05-08 | Discharge: 2024-05-08 | Disposition: A | Discharge status: Home / Self Care | Attending: Emergency Medicine | Admitting: Emergency Medicine

## 2024-05-08 DIAGNOSIS — M4802 Spinal stenosis, cervical region: Secondary | ICD-10-CM | POA: Insufficient documentation

## 2024-05-08 DIAGNOSIS — Y92410 Unspecified street and highway as the place of occurrence of the external cause: Secondary | ICD-10-CM | POA: Insufficient documentation

## 2024-05-08 MED ORDER — HYDROCODONE-ACETAMINOPHEN 5-325 MG PO TABS
1.0000 | ORAL_TABLET | Freq: Once | ORAL | Status: AC
  Administered 2024-05-08: 1 via ORAL
  Filled 2024-05-08: qty 1

## 2024-05-08 MED ORDER — METHOCARBAMOL 500 MG PO TABS: 500.0000 mg | ORAL_TABLET | Freq: Two times a day (BID) | ORAL | 0 refills | Status: AC

## 2024-05-08 MED ORDER — PREDNISONE 10 MG (21) PO TBPK: ORAL_TABLET | Freq: Every day | ORAL | 0 refills | Status: AC

## 2024-05-08 MED ORDER — LIDOCAINE 5 % EX PTCH: 1.0000 | MEDICATED_PATCH | CUTANEOUS | 0 refills | Status: AC
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
## 2024-05-17 DIAGNOSIS — M542 Cervicalgia: Secondary | ICD-10-CM | POA: Diagnosis not present

## 2024-05-17 DIAGNOSIS — M25512 Pain in left shoulder: Secondary | ICD-10-CM | POA: Diagnosis not present

## 2024-05-18 DIAGNOSIS — M25512 Pain in left shoulder: Secondary | ICD-10-CM | POA: Diagnosis not present

## 2024-05-18 DIAGNOSIS — M542 Cervicalgia: Secondary | ICD-10-CM | POA: Diagnosis not present

## 2024-05-19 DIAGNOSIS — Z6835 Body mass index (BMI) 35.0-35.9, adult: Secondary | ICD-10-CM | POA: Diagnosis not present

## 2024-05-19 DIAGNOSIS — M47812 Spondylosis without myelopathy or radiculopathy, cervical region: Secondary | ICD-10-CM | POA: Diagnosis not present

## 2024-05-24 DIAGNOSIS — M542 Cervicalgia: Secondary | ICD-10-CM | POA: Diagnosis not present

## 2024-05-24 DIAGNOSIS — M25512 Pain in left shoulder: Secondary | ICD-10-CM | POA: Diagnosis not present

## 2024-06-06 DIAGNOSIS — I10 Essential (primary) hypertension: Secondary | ICD-10-CM | POA: Diagnosis not present

## 2024-06-06 DIAGNOSIS — M542 Cervicalgia: Secondary | ICD-10-CM | POA: Diagnosis not present

## 2024-06-13 DIAGNOSIS — M542 Cervicalgia: Secondary | ICD-10-CM | POA: Diagnosis not present

## 2024-06-13 DIAGNOSIS — M25512 Pain in left shoulder: Secondary | ICD-10-CM | POA: Diagnosis not present

## 2024-06-20 DIAGNOSIS — M25512 Pain in left shoulder: Secondary | ICD-10-CM | POA: Diagnosis not present

## 2024-06-20 DIAGNOSIS — M542 Cervicalgia: Secondary | ICD-10-CM | POA: Diagnosis not present

## 2024-06-27 DIAGNOSIS — M25512 Pain in left shoulder: Secondary | ICD-10-CM | POA: Diagnosis not present

## 2024-06-27 DIAGNOSIS — M542 Cervicalgia: Secondary | ICD-10-CM | POA: Diagnosis not present

## 2024-07-04 DIAGNOSIS — M542 Cervicalgia: Secondary | ICD-10-CM | POA: Diagnosis not present

## 2024-07-04 DIAGNOSIS — M25512 Pain in left shoulder: Secondary | ICD-10-CM | POA: Diagnosis not present

## 2024-07-11 DIAGNOSIS — M25512 Pain in left shoulder: Secondary | ICD-10-CM | POA: Diagnosis not present

## 2024-07-11 DIAGNOSIS — M542 Cervicalgia: Secondary | ICD-10-CM | POA: Diagnosis not present

## 2024-08-31 DIAGNOSIS — I10 Essential (primary) hypertension: Secondary | ICD-10-CM | POA: Diagnosis not present

## 2024-08-31 DIAGNOSIS — E119 Type 2 diabetes mellitus without complications: Secondary | ICD-10-CM | POA: Diagnosis not present

## 2024-09-05 DIAGNOSIS — N3000 Acute cystitis without hematuria: Secondary | ICD-10-CM | POA: Diagnosis not present

## 2024-09-05 DIAGNOSIS — E669 Obesity, unspecified: Secondary | ICD-10-CM | POA: Diagnosis not present

## 2024-09-05 DIAGNOSIS — E118 Type 2 diabetes mellitus with unspecified complications: Secondary | ICD-10-CM | POA: Diagnosis not present

## 2024-09-05 DIAGNOSIS — I10 Essential (primary) hypertension: Secondary | ICD-10-CM | POA: Diagnosis not present

## 2024-09-05 DIAGNOSIS — Z0000 Encounter for general adult medical examination without abnormal findings: Secondary | ICD-10-CM | POA: Diagnosis not present
